# Patient Record
Sex: Female | Born: 1955 | Race: Black or African American | Hispanic: No | Marital: Married | Smoking: Never smoker
Health system: Southern US, Community
[De-identification: ages and names within clinical notes are randomized; demographics above are authoritative.]

## PROBLEM LIST (undated history)

## (undated) DIAGNOSIS — R51 Headache: Secondary | ICD-10-CM

## (undated) DIAGNOSIS — M1990 Unspecified osteoarthritis, unspecified site: Secondary | ICD-10-CM

## (undated) DIAGNOSIS — I10 Essential (primary) hypertension: Secondary | ICD-10-CM

## (undated) DIAGNOSIS — L0390 Cellulitis, unspecified: Secondary | ICD-10-CM

## (undated) HISTORY — PX: BACK SURGERY: SHX140

## (undated) HISTORY — PX: CARPAL TUNNEL RELEASE: SHX101

---

## 1998-05-23 ENCOUNTER — Encounter: Admission: RE | Admit: 1998-05-23 | Discharge: 1998-05-23 | Payer: Self-pay | Admitting: Family Medicine

## 1998-05-29 ENCOUNTER — Encounter: Admission: RE | Admit: 1998-05-29 | Discharge: 1998-05-29 | Payer: Self-pay | Admitting: Sports Medicine

## 1998-06-05 ENCOUNTER — Encounter: Admission: RE | Admit: 1998-06-05 | Discharge: 1998-06-05 | Payer: Self-pay | Admitting: Sports Medicine

## 1998-06-06 ENCOUNTER — Encounter: Admission: RE | Admit: 1998-06-06 | Discharge: 1998-06-06 | Payer: Self-pay | Admitting: Family Medicine

## 1999-06-26 ENCOUNTER — Encounter: Admission: RE | Admit: 1999-06-26 | Discharge: 1999-06-26 | Payer: Self-pay | Admitting: Family Medicine

## 2000-06-28 ENCOUNTER — Encounter: Admission: RE | Admit: 2000-06-28 | Discharge: 2000-06-28 | Payer: Self-pay | Admitting: Family Medicine

## 2000-07-13 ENCOUNTER — Encounter: Payer: Self-pay | Admitting: Sports Medicine

## 2000-07-13 ENCOUNTER — Encounter: Admission: RE | Admit: 2000-07-13 | Discharge: 2000-07-13 | Payer: Self-pay | Admitting: Sports Medicine

## 2001-03-15 ENCOUNTER — Encounter: Admission: RE | Admit: 2001-03-15 | Discharge: 2001-03-15 | Payer: Self-pay | Admitting: Family Medicine

## 2001-04-07 ENCOUNTER — Encounter: Admission: RE | Admit: 2001-04-07 | Discharge: 2001-04-07 | Payer: Self-pay | Admitting: Family Medicine

## 2001-04-29 ENCOUNTER — Encounter: Admission: RE | Admit: 2001-04-29 | Discharge: 2001-04-29 | Payer: Self-pay | Admitting: Family Medicine

## 2001-07-14 ENCOUNTER — Encounter: Admission: RE | Admit: 2001-07-14 | Discharge: 2001-07-14 | Payer: Self-pay | Admitting: Family Medicine

## 2001-07-14 ENCOUNTER — Encounter: Payer: Self-pay | Admitting: Family Medicine

## 2001-07-26 ENCOUNTER — Encounter: Admission: RE | Admit: 2001-07-26 | Discharge: 2001-07-26 | Payer: Self-pay | Admitting: Family Medicine

## 2001-07-28 ENCOUNTER — Encounter: Admission: RE | Admit: 2001-07-28 | Discharge: 2001-07-28 | Payer: Self-pay | Admitting: Family Medicine

## 2001-07-29 ENCOUNTER — Encounter: Admission: RE | Admit: 2001-07-29 | Discharge: 2001-07-29 | Payer: Self-pay | Admitting: Sports Medicine

## 2001-07-29 ENCOUNTER — Encounter: Payer: Self-pay | Admitting: Sports Medicine

## 2001-08-23 ENCOUNTER — Encounter: Admission: RE | Admit: 2001-08-23 | Discharge: 2001-08-23 | Payer: Self-pay | Admitting: Sports Medicine

## 2001-08-24 ENCOUNTER — Encounter: Admission: RE | Admit: 2001-08-24 | Discharge: 2001-08-24 | Payer: Self-pay | Admitting: Family Medicine

## 2001-08-25 ENCOUNTER — Encounter: Admission: RE | Admit: 2001-08-25 | Discharge: 2001-08-25 | Payer: Self-pay | Admitting: Sports Medicine

## 2001-09-28 ENCOUNTER — Encounter: Admission: RE | Admit: 2001-09-28 | Discharge: 2001-09-28 | Payer: Self-pay | Admitting: Family Medicine

## 2001-10-28 ENCOUNTER — Encounter: Admission: RE | Admit: 2001-10-28 | Discharge: 2001-10-28 | Payer: Self-pay | Admitting: Family Medicine

## 2001-12-01 ENCOUNTER — Encounter: Admission: RE | Admit: 2001-12-01 | Discharge: 2001-12-01 | Payer: Self-pay | Admitting: Sports Medicine

## 2002-02-20 ENCOUNTER — Encounter: Admission: RE | Admit: 2002-02-20 | Discharge: 2002-02-20 | Payer: Self-pay | Admitting: Family Medicine

## 2002-04-19 ENCOUNTER — Encounter: Admission: RE | Admit: 2002-04-19 | Discharge: 2002-04-19 | Payer: Self-pay | Admitting: Family Medicine

## 2002-07-31 ENCOUNTER — Encounter: Admission: RE | Admit: 2002-07-31 | Discharge: 2002-07-31 | Payer: Self-pay | Admitting: Sports Medicine

## 2002-07-31 ENCOUNTER — Encounter: Payer: Self-pay | Admitting: Sports Medicine

## 2002-08-22 ENCOUNTER — Encounter: Admission: RE | Admit: 2002-08-22 | Discharge: 2002-08-22 | Payer: Self-pay | Admitting: Family Medicine

## 2002-08-22 ENCOUNTER — Encounter: Payer: Self-pay | Admitting: Sports Medicine

## 2002-08-22 ENCOUNTER — Encounter: Admission: RE | Admit: 2002-08-22 | Discharge: 2002-08-22 | Payer: Self-pay | Admitting: Sports Medicine

## 2002-09-04 ENCOUNTER — Encounter: Admission: RE | Admit: 2002-09-04 | Discharge: 2002-09-04 | Payer: Self-pay | Admitting: Family Medicine

## 2002-10-03 ENCOUNTER — Encounter: Admission: RE | Admit: 2002-10-03 | Discharge: 2002-10-03 | Payer: Self-pay | Admitting: Family Medicine

## 2003-02-28 ENCOUNTER — Encounter: Admission: RE | Admit: 2003-02-28 | Discharge: 2003-02-28 | Payer: Self-pay | Admitting: Family Medicine

## 2003-04-19 ENCOUNTER — Encounter: Admission: RE | Admit: 2003-04-19 | Discharge: 2003-04-19 | Payer: Self-pay | Admitting: Family Medicine

## 2003-05-16 ENCOUNTER — Encounter: Admission: RE | Admit: 2003-05-16 | Discharge: 2003-05-16 | Payer: Self-pay | Admitting: Neurosurgery

## 2003-05-29 ENCOUNTER — Inpatient Hospital Stay (HOSPITAL_COMMUNITY): Admission: RE | Admit: 2003-05-29 | Discharge: 2003-06-01 | Payer: Self-pay | Admitting: Neurosurgery

## 2003-06-05 ENCOUNTER — Ambulatory Visit: Admission: RE | Admit: 2003-06-05 | Discharge: 2003-06-05 | Payer: Self-pay | Admitting: Neurosurgery

## 2003-07-16 ENCOUNTER — Encounter: Admission: RE | Admit: 2003-07-16 | Discharge: 2003-07-16 | Payer: Self-pay | Admitting: Neurosurgery

## 2003-08-02 ENCOUNTER — Encounter: Admission: RE | Admit: 2003-08-02 | Discharge: 2003-08-02 | Payer: Self-pay | Admitting: Sports Medicine

## 2003-08-29 ENCOUNTER — Encounter: Admission: RE | Admit: 2003-08-29 | Discharge: 2003-08-29 | Payer: Self-pay | Admitting: Neurosurgery

## 2003-10-26 ENCOUNTER — Encounter: Admission: RE | Admit: 2003-10-26 | Discharge: 2003-10-26 | Payer: Self-pay | Admitting: Sports Medicine

## 2003-12-03 ENCOUNTER — Emergency Department (HOSPITAL_COMMUNITY): Admission: EM | Admit: 2003-12-03 | Discharge: 2003-12-04 | Payer: Self-pay | Admitting: Emergency Medicine

## 2004-01-23 ENCOUNTER — Encounter: Admission: RE | Admit: 2004-01-23 | Discharge: 2004-01-23 | Payer: Self-pay | Admitting: Sports Medicine

## 2004-03-14 ENCOUNTER — Ambulatory Visit: Payer: Self-pay | Admitting: Family Medicine

## 2004-08-19 ENCOUNTER — Encounter: Admission: RE | Admit: 2004-08-19 | Discharge: 2004-08-19 | Payer: Self-pay | Admitting: Radiology

## 2005-01-11 ENCOUNTER — Encounter (INDEPENDENT_AMBULATORY_CARE_PROVIDER_SITE_OTHER): Payer: Self-pay | Admitting: *Deleted

## 2005-01-23 ENCOUNTER — Ambulatory Visit: Payer: Self-pay | Admitting: Family Medicine

## 2005-02-06 ENCOUNTER — Ambulatory Visit: Payer: Self-pay | Admitting: Sports Medicine

## 2005-02-10 ENCOUNTER — Ambulatory Visit: Payer: Self-pay | Admitting: Family Medicine

## 2005-02-10 ENCOUNTER — Encounter: Admission: RE | Admit: 2005-02-10 | Discharge: 2005-02-10 | Payer: Self-pay | Admitting: Sports Medicine

## 2005-03-18 ENCOUNTER — Ambulatory Visit: Payer: Self-pay | Admitting: Family Medicine

## 2005-07-02 ENCOUNTER — Ambulatory Visit: Payer: Self-pay | Admitting: Family Medicine

## 2005-09-02 ENCOUNTER — Encounter: Admission: RE | Admit: 2005-09-02 | Discharge: 2005-09-02 | Payer: Self-pay | Admitting: Sports Medicine

## 2005-10-05 ENCOUNTER — Ambulatory Visit: Payer: Self-pay | Admitting: Sports Medicine

## 2006-03-10 ENCOUNTER — Ambulatory Visit: Payer: Self-pay | Admitting: Sports Medicine

## 2006-03-22 ENCOUNTER — Emergency Department (HOSPITAL_COMMUNITY): Admission: EM | Admit: 2006-03-22 | Discharge: 2006-03-22 | Payer: Self-pay | Admitting: Emergency Medicine

## 2006-03-26 ENCOUNTER — Ambulatory Visit: Payer: Self-pay | Admitting: Family Medicine

## 2006-04-16 ENCOUNTER — Ambulatory Visit: Payer: Self-pay | Admitting: Family Medicine

## 2006-04-21 ENCOUNTER — Ambulatory Visit: Payer: Self-pay | Admitting: Family Medicine

## 2006-05-06 ENCOUNTER — Ambulatory Visit: Payer: Self-pay | Admitting: Family Medicine

## 2006-05-21 ENCOUNTER — Ambulatory Visit: Payer: Self-pay | Admitting: Sports Medicine

## 2006-06-30 ENCOUNTER — Ambulatory Visit: Payer: Self-pay | Admitting: Family Medicine

## 2006-08-05 DIAGNOSIS — E669 Obesity, unspecified: Secondary | ICD-10-CM

## 2006-08-05 DIAGNOSIS — I10 Essential (primary) hypertension: Secondary | ICD-10-CM

## 2006-08-05 DIAGNOSIS — M1990 Unspecified osteoarthritis, unspecified site: Secondary | ICD-10-CM

## 2006-08-05 DIAGNOSIS — K219 Gastro-esophageal reflux disease without esophagitis: Secondary | ICD-10-CM

## 2006-08-06 ENCOUNTER — Encounter (INDEPENDENT_AMBULATORY_CARE_PROVIDER_SITE_OTHER): Payer: Self-pay | Admitting: *Deleted

## 2006-08-25 ENCOUNTER — Encounter (INDEPENDENT_AMBULATORY_CARE_PROVIDER_SITE_OTHER): Payer: Self-pay | Admitting: Family Medicine

## 2006-09-07 ENCOUNTER — Other Ambulatory Visit: Admission: RE | Admit: 2006-09-07 | Discharge: 2006-09-07 | Payer: Self-pay | Admitting: Nephrology

## 2006-09-08 ENCOUNTER — Encounter (INDEPENDENT_AMBULATORY_CARE_PROVIDER_SITE_OTHER): Payer: Self-pay | Admitting: Family Medicine

## 2006-09-08 ENCOUNTER — Encounter: Admission: RE | Admit: 2006-09-08 | Discharge: 2006-09-08 | Payer: Self-pay | Admitting: Sports Medicine

## 2006-10-20 ENCOUNTER — Encounter (INDEPENDENT_AMBULATORY_CARE_PROVIDER_SITE_OTHER): Payer: Self-pay | Admitting: Family Medicine

## 2006-11-24 ENCOUNTER — Encounter (INDEPENDENT_AMBULATORY_CARE_PROVIDER_SITE_OTHER): Payer: Self-pay | Admitting: Family Medicine

## 2006-12-20 ENCOUNTER — Encounter: Payer: Self-pay | Admitting: Family Medicine

## 2007-05-03 ENCOUNTER — Encounter: Admission: RE | Admit: 2007-05-03 | Discharge: 2007-05-03 | Payer: Self-pay | Admitting: Nephrology

## 2007-06-21 ENCOUNTER — Encounter: Payer: Self-pay | Admitting: Family Medicine

## 2007-07-21 ENCOUNTER — Encounter: Payer: Self-pay | Admitting: Family Medicine

## 2007-08-09 ENCOUNTER — Encounter: Admission: RE | Admit: 2007-08-09 | Discharge: 2007-08-09 | Payer: Self-pay | Admitting: Nephrology

## 2007-09-12 ENCOUNTER — Encounter: Admission: RE | Admit: 2007-09-12 | Discharge: 2007-09-12 | Payer: Self-pay | Admitting: *Deleted

## 2007-11-24 ENCOUNTER — Ambulatory Visit (HOSPITAL_BASED_OUTPATIENT_CLINIC_OR_DEPARTMENT_OTHER): Admission: RE | Admit: 2007-11-24 | Discharge: 2007-11-24 | Payer: Self-pay | Admitting: Orthopedic Surgery

## 2007-12-02 ENCOUNTER — Encounter: Payer: Self-pay | Admitting: Family Medicine

## 2008-01-09 ENCOUNTER — Encounter: Payer: Self-pay | Admitting: Family Medicine

## 2008-04-03 ENCOUNTER — Encounter: Payer: Self-pay | Admitting: Family Medicine

## 2008-04-13 ENCOUNTER — Ambulatory Visit: Payer: Self-pay | Admitting: Family Medicine

## 2008-06-12 ENCOUNTER — Encounter: Admission: RE | Admit: 2008-06-12 | Discharge: 2008-06-12 | Payer: Self-pay | Admitting: Nephrology

## 2008-07-18 ENCOUNTER — Encounter: Admission: RE | Admit: 2008-07-18 | Discharge: 2008-07-18 | Payer: Self-pay | Admitting: Nephrology

## 2008-09-17 ENCOUNTER — Encounter: Admission: RE | Admit: 2008-09-17 | Discharge: 2008-09-17 | Payer: Self-pay | Admitting: Nephrology

## 2008-11-13 ENCOUNTER — Emergency Department (HOSPITAL_COMMUNITY): Admission: EM | Admit: 2008-11-13 | Discharge: 2008-11-13 | Payer: Self-pay | Admitting: Family Medicine

## 2009-01-17 ENCOUNTER — Encounter: Admission: RE | Admit: 2009-01-17 | Discharge: 2009-01-17 | Payer: Self-pay | Admitting: Specialist

## 2009-02-07 ENCOUNTER — Ambulatory Visit: Payer: Self-pay | Admitting: Family Medicine

## 2009-03-14 ENCOUNTER — Encounter: Payer: Self-pay | Admitting: Family Medicine

## 2009-03-25 ENCOUNTER — Encounter: Admission: RE | Admit: 2009-03-25 | Discharge: 2009-03-25 | Payer: Self-pay | Admitting: *Deleted

## 2009-03-29 ENCOUNTER — Encounter: Payer: Self-pay | Admitting: Family Medicine

## 2009-06-19 ENCOUNTER — Inpatient Hospital Stay (HOSPITAL_COMMUNITY): Admission: RE | Admit: 2009-06-19 | Discharge: 2009-06-25 | Payer: Self-pay | Admitting: Specialist

## 2009-09-10 ENCOUNTER — Encounter: Payer: Self-pay | Admitting: Family Medicine

## 2009-09-10 ENCOUNTER — Ambulatory Visit: Payer: Self-pay | Admitting: Family Medicine

## 2009-09-12 LAB — CONVERTED CEMR LAB
Alkaline Phosphatase: 111 units/L (ref 39–117)
CO2: 25 meq/L (ref 19–32)
Cholesterol: 223 mg/dL — ABNORMAL HIGH (ref 0–200)
Creatinine, Ser: 0.76 mg/dL (ref 0.40–1.20)
Glucose, Bld: 92 mg/dL (ref 70–99)
HCT: 34.5 % — ABNORMAL LOW (ref 36.0–46.0)
HDL: 45 mg/dL (ref >39)
LDL Cholesterol: 165 mg/dL — ABNORMAL HIGH (ref 0–99)
MCHC: 30.7 g/dL (ref 30.0–36.0)
MCV: 87.1 fL (ref 78.0–100.0)
Platelets: 467 10*3/uL — ABNORMAL HIGH (ref 150–400)
RBC: 3.96 M/uL (ref 3.87–5.11)
Sodium: 139 meq/L (ref 135–145)
Total Bilirubin: 0.4 mg/dL (ref 0.3–1.2)
Total CHOL/HDL Ratio: 5
Triglycerides: 67 mg/dL (ref <150)
VLDL: 13 mg/dL (ref 0–40)
WBC: 6.9 10*3/uL (ref 4.0–10.5)

## 2009-09-19 ENCOUNTER — Encounter: Admission: RE | Admit: 2009-09-19 | Discharge: 2009-09-19 | Payer: Self-pay | Admitting: Internal Medicine

## 2009-09-24 ENCOUNTER — Telehealth: Payer: Self-pay | Admitting: Family Medicine

## 2009-09-25 ENCOUNTER — Encounter: Admission: RE | Admit: 2009-09-25 | Discharge: 2009-09-25 | Payer: Self-pay | Admitting: Internal Medicine

## 2009-10-30 ENCOUNTER — Telehealth: Payer: Self-pay | Admitting: Family Medicine

## 2009-11-08 ENCOUNTER — Ambulatory Visit: Payer: Self-pay | Admitting: Family Medicine

## 2009-11-08 ENCOUNTER — Encounter: Payer: Self-pay | Admitting: Family Medicine

## 2009-11-08 DIAGNOSIS — E785 Hyperlipidemia, unspecified: Secondary | ICD-10-CM | POA: Insufficient documentation

## 2009-11-08 DIAGNOSIS — F329 Major depressive disorder, single episode, unspecified: Secondary | ICD-10-CM

## 2009-11-08 DIAGNOSIS — F3289 Other specified depressive episodes: Secondary | ICD-10-CM | POA: Insufficient documentation

## 2009-11-12 ENCOUNTER — Encounter: Payer: Self-pay | Admitting: Family Medicine

## 2009-11-28 ENCOUNTER — Encounter: Payer: Self-pay | Admitting: Family Medicine

## 2010-01-13 ENCOUNTER — Telehealth: Payer: Self-pay | Admitting: Family Medicine

## 2010-01-17 ENCOUNTER — Ambulatory Visit: Payer: Self-pay | Admitting: Family Medicine

## 2010-01-17 DIAGNOSIS — R42 Dizziness and giddiness: Secondary | ICD-10-CM

## 2010-03-14 ENCOUNTER — Encounter: Payer: Self-pay | Admitting: Family Medicine

## 2010-04-10 ENCOUNTER — Encounter: Payer: Self-pay | Admitting: Family Medicine

## 2010-06-28 ENCOUNTER — Encounter: Payer: Self-pay | Admitting: Neurosurgery

## 2010-06-29 ENCOUNTER — Encounter: Payer: Self-pay | Admitting: Internal Medicine

## 2010-07-10 NOTE — Progress Notes (Signed)
Summary: triage   Phone Note Call from Patient Call back at Home Phone [phone]   Caller: Patient Summary of Call: Pt having some vertigo. Initial call taken by: Clydell Hakim,  January 13, 2010 9:33 AM  Follow-up for Phone Call        LM Follow-up by: Golden Circle RN,  January 13, 2010 9:39 AM  Additional Follow-up for Phone Call Additional follow up Details #1::        started friday night. not better. difficulty standing. vomiting alot. no ride here today. appt made for tomorrow pm when her husband can bring her. advised moving slowly. try small sips of water frequently as tolerated to prevent dehydration. call if able to come sooner.she agreed with plan Additional Follow-up by: Golden Circle RN,  January 13, 2010 9:52 AM

## 2010-07-10 NOTE — Letter (Signed)
Summary: Generic Letter  [facility]  [address]   [place], [place] [address]   Phone: [phone]  Fax: [phone]    11/12/2009  [facility] [place] [address] [place], [place]  [address]  Dear Ms. Shelnutt,  I am happy to inform you that your LDL, the "bad" cholesterol, is much improved on your medicine.  It was 165.  It is now down to 98 and we want it less than 100.  Great job!  Please continue to take the medicine.  If you hve any questions, please call our office.          Sincerely,   Ardeen Garland  MD  Appended Document: Generic Letter mailed

## 2010-07-10 NOTE — Letter (Signed)
Summary: Generic Letter  [facility]     [place], [place]    Phone:   Fax:     03/14/2010  [facility] [place] [address] [place], [place]  [address]  Dear Ms. Armacost,   I am writing to remind you to please schedule a mammogram to screen for breast cancer as well as a colonsocopy to screen for colon cancer.  For your mammogram, please call the [facility] at  [phone].  For your colonoscopy, you can call either [facility] or [facility] to set up an appointment.  The number for [facility] is [phone].  The number for [place] is [phone].  If you have any trouble scheduling it, then please call and let us know and we will try to help arrange it.   I look forward to meeting you at your next well-woman visit!      Sincerely,   Demetria Pore MD  Appended Document: Generic Letter mailed.

## 2010-07-10 NOTE — Assessment & Plan Note (Signed)
Summary: cpe/pap,tcb   Vital Signs:  Patient profile:   55 year old female Height:      60 inches Weight:      201 pounds BMI:     39.40 Pulse rate:   71 / minute BP sitting:   108 / 62  (left arm)  Vitals Entered By: Arlyss Repress CMA, (September 10, 2009 9:29 AM) CC: pap.  Is Patient Diabetic? No Pain Assessment Patient in pain? no        Primary Care Provider:  Ardeen Garland  MD  CC:  pap. Marland Kitchen  History of Present Illness: She is here for a complete physical.  She complains of CP and cough. 1) CP - "comes and goes".  Lasts about a minute.  Feels like a "tootheache".  "not sharp, not pressure, no tightness".  Goes away.  Sometimes sitting, walking, laying down.  Can happen at any time.  NO associated nausea, dizziness, SOB, or diaphoresis.  Going on for about a year.  Not everyday - not even every month.  Thinks it is related to stress.  FAmily is "in chaos".  Has not felt it in 4-6 weeks.  2) Cough - feels like phlegm gets caught.  Only sometimes coughs something up - green or yellow or white and "slimy".  Worse when she has a cold.  Never smoked but her husband smokes.  bothering her for a year.  Bothers her everyday.  No sore throat.  Sinuses do feel congested.  Sneezes a lot.  Does seem to struggle with allergies.  Nose feels congested at night making it difficult to breathe.  Habits & Providers  Alcohol-Tobacco-Diet     Tobacco Status: never  Current Medications (verified): 1)  Fluticasone Propionate 50 Mcg/act Susp (Fluticasone Propionate) .... 2 Sprays Each Nostril Daily For Allergies/congestion 2)  Loratadine 10 Mg Tabs (Loratadine) .Marland Kitchen.. 1 Tab By Mouth Daily For Allergies/congestion 3)  Hydrochlorothiazide 25 Mg Tabs (Hydrochlorothiazide) .Marland Kitchen.. 1 Tab By Mouth Daily For High Blood Pressure.  Physical Exam  General:  obese, alert, NAD vitals reviewed Eyes:  pupils equal, pupils round, and pupils reactive to light.   conjunctiva clear, no injection Ears:  R ear normal and L  ear normal.   Nose:  no external deformity, no external erythema, and no nasal discharge.   Mouth:  OP pink and moist Lungs:  normal WOB, CTAB Heart:  RRR without murmur Abdomen:  obese, soft, ND, NT Genitalia:  unable to palpate adnexae due to habitus. normal introitus, no external lesions, no vaginal discharge, mucosa pink and moist, no vaginal or cervical lesions, no vaginal atrophy, no friaility or hemorrhage, and normal uterus size and position.   Pulses:  2+ radial and dp pulses Extremities:  no edema Cervical Nodes:  No lymphadenopathy noted   Impression & Recommendations:  Problem # 1:  ROUTINE GYNECOLOGICAL EXAMINATION (ICD-V72.31) Assessment New  Pap obtained.  Referred for mammo - patient states it is scheduled for next month already. Discussed colonscopy.  Advised patient to call [facility] or [facility]. STates husband's was from [place] so she will probably call them to schedule it at her earliest convenicne.  Will let us know if she has trouble scheduling.  CMET, FLP, CBC obtained due to HTN and screenign for CV disease.   Orders: [facility] - Est  40-64 yrs (875[address])  Problem # 2:  HYPERTENSION, BENIGN SYSTEMIC (ICD-401.1) Assessment: Unchanged Had been getting her meds from Dr. Bascom Levels.  Vague about how long she was seeing  him and how she came about to see him and [place], as we do the same thing.  However states only coming here now. Does not need refilla t this time though.  Her updated medication list for this problem includes:    Hydrochlorothiazide 25 Mg Tabs (Hydrochlorothiazide) .Marland Kitchen... 1 tab by mouth daily for high blood pressure.  Orders: Lipid-[facility] ([id number]) Comp Met-[facility] [id number]) CBC-[facility] ([address])  Complete Medication List: 1)  Fluticasone Propionate 50 Mcg/act Susp (Fluticasone propionate) .... 2 sprays each nostril daily for allergies/congestion 2)  Loratadine 10 Mg Tabs (Loratadine) .Marland Kitchen.. 1 tab by mouth daily for allergies/congestion 3)  Hydrochlorothiazide 25 Mg Tabs  (Hydrochlorothiazide) .Marland Kitchen.. 1 tab by mouth daily for high blood pressure.  Other Orders: Pap Smear-[facility] ([id number])  Patient Instructions: 1)  Please call to set up your mammogram at your earliest convenience.  The  number is on the handout I gave you. 2)  Now that you are over 50, you should get a colonscopy to ensure you do not have anything that could become colon cancer growing in your colon.  You can call either [facility] or [facility] to set up an appointment.  The number for [facility] is [phone].  The number for [place] is [phone].  If you have any trouble scheduling it, then please call and let us know and we will try to help arrange it.  3)  Use the flonase and loratadine for your cough and phlegm.  Use it everyday. 4)  Please call and let me know what dose your Hydrochlorothiazide is.   Prescriptions: HYDROCHLOROTHIAZIDE 25 MG TABS (HYDROCHLOROTHIAZIDE) 1 tab by mouth daily for high blood pressure.  #33 x 0   Entered and Authorized by:   Ardeen Garland  MD   Signed by:   Ardeen Garland  MD on 09/10/2009   Method used:   Print then Give to Patient   RxID:   [id number] LORATADINE 10 MG TABS (LORATADINE) 1 tab by mouth daily for allergies/congestion  #33 x 11   Entered and Authorized by:   Ardeen Garland  MD   Signed by:   Ardeen Garland  MD on 09/10/2009   Method used:   Print then Give to Patient   RxID:   [id number] FLUTICASONE PROPIONATE 50 MCG/ACT SUSP (FLUTICASONE PROPIONATE) 2 sprays each nostril daily for allergies/congestion  #1 x 11   Entered and Authorized by:   Ardeen Garland  MD   Signed by:   Ardeen Garland  MD on 09/10/2009   Method used:   Print then Give to Patient   RxID:   [id number]   Prevention & Chronic Care Immunizations   Influenza vaccine: Not documented    Tetanus booster: 06/09/1999: Done.    Pneumococcal vaccine: Not documented  Colorectal Screening   Hemoccult: Done.  (02/06/2005)    Colonoscopy: Not documented  Other  Screening   Pap smear: Done.  (01/11/2005)    Mammogram: April 2009 - BiRads 1  (09/15/2007)   Mammogram action/deferral: Screening mammogram in 1 year.     (09/08/2006)   Smoking status: never  (09/10/2009)  Lipids   Total Cholesterol: Not documented   LDL: Not documented   LDL Direct: Not documented   HDL: Not documented   Triglycerides: Not documented  Hypertension   Last Blood Pressure: 108 / 62  (09/10/2009)   Serum creatinine: Not documented   Serum potassium Not documented CMP ordered     Hypertension flowsheet reviewed?: Yes   Progress toward BP goal:  At goal  Self-Management Support :   Personal Goals (by the next clinic visit) :      Personal blood pressure goal: 140/90  (09/10/2009)   Hypertension self-management support: Not documented

## 2010-07-10 NOTE — Progress Notes (Signed)
  Medications Added FERROUS SULFATE 325 (65 FE) MG TABS (FERROUS SULFATE) 1 tab by mouth daily for anemia SIMVASTATIN 40 MG TABS (SIMVASTATIN) 1 tab by mouth qHS for high cholesterol       Phone Note Outgoing Call   Call placed by: Ardeen Garland, MD Call placed to: Patient Reason for Call: Discuss lab or test results Details for Reason: Give lab results, discuss med need Summary of Call: Informed patient of normal pap.  She is anemic - she knew this.  She has been since her surgery.  Not taking any iron.  Also, cholesterol is high.  Will start simvastatin.  Patient okay with this.  Will let us know if she develops muscle pain or weakness and will return in 2 months for a recheck.  [facility] [address].     New/Updated Medications: FERROUS SULFATE 325 (65 FE) MG TABS (FERROUS SULFATE) 1 tab by mouth daily for anemia SIMVASTATIN 40 MG TABS (SIMVASTATIN) 1 tab by mouth qHS for high cholesterol Prescriptions: SIMVASTATIN 40 MG TABS (SIMVASTATIN) 1 tab by mouth qHS for high cholesterol  #30 x 5   Entered and Authorized by:   Ardeen Garland  MD   Signed by:   Ardeen Garland  MD on 09/24/2009   Method used:   [facility]        [facility]  [address] [id number]       [address]       [place], [place]  [address]       Ph: [phone] or [phone]       Fax: [phone]   RxID:   [id number] FERROUS SULFATE 325 (65 FE) MG TABS (FERROUS SULFATE) 1 tab by mouth daily for anemia  #30 x 1   Entered and Authorized by:   Ardeen Garland  MD   Signed by:   Ardeen Garland  MD on 09/24/2009   Method used:   [facility]  [address] [id number]       [address]       [place], [place]  [address]       Ph: [phone] or [phone]       Fax: [phone]   RxID:   [id number]

## 2010-07-10 NOTE — Assessment & Plan Note (Signed)
Summary: vertigo,tcb   Vital Signs:  Patient profile:   55 year old female Height:      60 inches Weight:      210 pounds BMI:     41.16 Temp:     99.6 degrees F oral BP sitting:   118 / 84  (left arm) Cuff size:   large  Vitals Entered By: Tessie Fass CMA (January 17, 2010 10:36 AM) CC: vertigo Is Patient Diabetic? No Pain Assessment Patient in pain? yes     Location: left ear Intensity: 3   Primary Care Provider:  Ardeen Garland  MD  CC:  vertigo.  History of Present Illness: Here for refill on her meclizine.  She reports a history of vertigo since 2004.  She has several bouts per year, her left ear hurts and rings during the attacks.  He hearing in that ear is not as good.  She finds that meclizine is effective.  She denies fall.  Multiple back surgeries, most recent this past January.  Current Medications (verified): 1)  Fluticasone Propionate 50 Mcg/act Susp (Fluticasone Propionate) .... 2 Sprays Each Nostril Daily For Allergies/congestion 2)  Loratadine 10 Mg Tabs (Loratadine) .Marland Kitchen.. 1 Tab By Mouth Daily For Allergies/congestion 3)  Hydrochlorothiazide 25 Mg Tabs (Hydrochlorothiazide) .Marland Kitchen.. 1 Tab By Mouth Daily For High Blood Pressure. 4)  Simvastatin 40 Mg Tabs (Simvastatin) .Marland Kitchen.. 1 Tab By Mouth Qhs For High Cholesterol 5)  Meloxicam 7.5 Mg Tabs (Meloxicam) .Marland Kitchen.. 1 Tab By Mouth Daily For Arthritis 6)  Fluoxetine Hcl 20 Mg Caps (Fluoxetine Hcl) .Marland Kitchen.. 1 Tab By Mouth Daily For Depression 7)  Meclizine Hcl 25 Mg Tabs (Meclizine Hcl) .... One Tab Q 8 Hours As Needed Vertigo  Allergies (verified): No Known Drug Allergies  Review of Systems General:  Denies chills and fever. ENT:  Complains of earache and ringing in ears; denies nasal congestion, sinus pressure, and sore throat. Neuro:  Complains of sensation of room spinning; denies poor balance and visual disturbances.  Physical Exam  General:  Obese, slow moving Eyes:  PERL, EOMs jumpy and movement triggers  vertigo Ears:  TM grey with normal landmaarks.  Testing to gross hearing decreased on the left as compaired to the right. Lungs:  normal respiratory effort and normal breath sounds.   Heart:  normal rate and regular rhythm.   Msk:  Lower back in flexion position    Impression & Recommendations:  Problem # 1:  INTERMITTENT VERTIGO (ICD-780.4)  may have Menie[place], with tinnitis, hearing decreased and recurrance of symptoms several times a year.  Refilled meclizine Her updated medication list for this problem includes:    Loratadine 10 Mg Tabs (Loratadine) .Marland Kitchen... 1 tab by mouth daily for allergies/congestion    Meclizine Hcl 25 Mg Tabs (Meclizine hcl) ..... One tab q 8 hours as needed vertigo  Orders: [facility]- Est Level  3 (14782)  Complete Medication List: 1)  Fluticasone Propionate 50 Mcg/act Susp (Fluticasone propionate) .... 2 sprays each nostril daily for allergies/congestion 2)  Loratadine 10 Mg Tabs (Loratadine) .Marland Kitchen.. 1 tab by mouth daily for allergies/congestion 3)  Hydrochlorothiazide 25 Mg Tabs (Hydrochlorothiazide) .Marland Kitchen.. 1 tab by mouth daily for high blood pressure. 4)  Simvastatin 40 Mg Tabs (Simvastatin) .Marland Kitchen.. 1 tab by mouth qhs for high cholesterol 5)  Meloxicam 7.5 Mg Tabs (Meloxicam) .Marland Kitchen.. 1 tab by mouth daily for arthritis 6)  Fluoxetine Hcl 20 Mg Caps (Fluoxetine hcl) .Marland Kitchen.. 1 tab by mouth daily for depression 7)  Meclizine Hcl 25 Mg Tabs (  Meclizine hcl) .... One tab q 8 hours as needed vertigo  Patient Instructions: 1)  Be very careful driving 2)  If vertigo would not improve may need to see specialist  Prescriptions: MECLIZINE HCL 25 MG TABS (MECLIZINE HCL) one tab q 8 hours as needed vertigo  #90 x 1   Entered and Authorized by:   Luretha Murphy NP   Signed by:   Luretha Murphy NP on 01/17/2010   Method used:   [facility]  [address] [id number]       [address]       [place], [place]  [address]       Ph: [phone] or  [phone]       Fax: [phone]   RxID:   [id number]

## 2010-07-10 NOTE — Assessment & Plan Note (Signed)
Summary: f/u,df   Vital Signs:  Patient profile:   54 year old female Height:      60 inches Weight:      206 pounds BMI:     40.38 Temp:     99.0 degrees F oral Pulse rate:   91 / minute BP sitting:   101 / 73  (left arm) Cuff size:   large  Vitals Entered By: Tessie Fass CMA (November 08, 2009 8:52 AM) CC: F/U Is Patient Diabetic? No Pain Assessment Patient in pain? no        Primary Care Provider:  Ardeen Garland  MD  CC:  F/U.  History of Present Illness: Ms. Rotondo is here for follow-up of high cholesterol and also endorses depression. 1) HLD - diagnosed on labwork at her physical in April. Started on simvstatin 40 at night.  Has been taking every night for about 6 weeks.  Tolerating it well.  No muscle pain, weakness, or GI side effects. 2) Depression - has 3 daughters.  Oletta Lamas, and [place].  Octavio Graves is the youngest and causing a lot of problems.  Stresses her out a lot.  Feeling "so depressed" about the whole situation.  No SI or HI.  Would like to try something for this.  Denies trouble sleeping.    Habits & Providers  Alcohol-Tobacco-Diet     Tobacco Status: never  Physical Exam  General:  obese, alert, NAD vitals reviewed Lungs:  Normal respiratory effort, chest expands symmetrically. Lungs are clear to auscultation, no crackles or wheezes. Heart:  Normal rate and regular rhythm. S1 and S2 normal without gallop, murmur, click, rub or other extra sounds. Psych:  fair eye contact. Oriented X3, memory intact for recent and remote, normally interactive, and flat affect.     Impression & Recommendations:  Problem # 1:  HYPERLIPIDEMIA (ICD-272.4) Assessment New Recheck direct LDL to look for improvement on simvastatin.  Her updated medication list for this problem includes:    Simvastatin 40 Mg Tabs (Simvastatin) .Marland Kitchen... 1 tab by mouth qhs for high cholesterol  Orders: Direct LDL-[facility] ([address]-98119) [facility]- Est  Level 4 (14782)  Problem # 2:  DEPRESSION  (ICD-311) Assessment: New  Will try fluoxetine.   Her updated medication list for this problem includes:    Fluoxetine Hcl 20 Mg Caps (Fluoxetine hcl) .Marland Kitchen... 1 tab by mouth daily for depression  Orders: [facility]- Est  Level 4 (95621)  Complete Medication List: 1)  Fluticasone Propionate 50 Mcg/act Susp (Fluticasone propionate) .... 2 sprays each nostril daily for allergies/congestion 2)  Loratadine 10 Mg Tabs (Loratadine) .Marland Kitchen.. 1 tab by mouth daily for allergies/congestion 3)  Hydrochlorothiazide 25 Mg Tabs (Hydrochlorothiazide) .Marland Kitchen.. 1 tab by mouth daily for high blood pressure. 4)  Ferrous Sulfate 325 (65 Fe) Mg Tabs (Ferrous sulfate) .Marland Kitchen.. 1 tab by mouth daily for anemia 5)  Simvastatin 40 Mg Tabs (Simvastatin) .Marland Kitchen.. 1 tab by mouth qhs for high cholesterol 6)  Meloxicam 7.5 Mg Tabs (Meloxicam) .Marland Kitchen.. 1 tab by mouth daily for arthritis 7)  Fluoxetine Hcl 20 Mg Caps (Fluoxetine hcl) .Marland Kitchen.. 1 tab by mouth daily for depression  Patient Instructions: 1)  Use the meloxicam once a day for your arthritis.  If you aren't hurting, don't use it, but only take it once a day. 2)  The fluoxetine is for depression.  It can take several weeks to really notice a change for the better.  However, if you feel it is making your feel worse, stop it and  let me know. 3)  Please return in 3 months for your next check-up or sooner if the fluoxetine isn't helping within 6-8 weeks.  Prescriptions: FLUOXETINE HCL 20 MG CAPS (FLUOXETINE HCL) 1 tab by mouth daily for depression  #33 x 2   Entered and Authorized by:   Ardeen Garland  MD   Signed by:   Ardeen Garland  MD on 11/08/2009   Method used:   Print then Give to Patient   RxID:   [id number] MELOXICAM 7.5 MG TABS (MELOXICAM) 1 tab by mouth daily for arthritis  #33 x 2   Entered and Authorized by:   Ardeen Garland  MD   Signed by:   Ardeen Garland  MD on 11/08/2009   Method used:   Print then Give to Patient   RxID:   [id number]

## 2010-07-10 NOTE — Progress Notes (Signed)
Summary: phn msg   Phone Note Call from Patient Call back at Home Phone [phone]   Caller: Patient Summary of Call: Wanted to let Dr. Georgiana Shore know that she has scheduled colonoscopy for June 16th and they need lab work faxed to [facility].   Initial call taken by: Clydell Hakim,  Oct 30, 2009 9:02 AM  Follow-up for Phone Call        faxed most recent labs from 09/10/09 visit. Follow-up by: Tessie Fass CMA,  Oct 30, 2009 11:55 AM

## 2010-07-10 NOTE — Consult Note (Signed)
[facility]  [facility]   Imported By: Clydell Hakim 12/12/2009 15:57:23  _____________________________________________________________________  External Attachment:    Type:   Image     Comment:   External Document

## 2010-07-10 NOTE — Miscellaneous (Signed)
   Clinical Lists Changes  Problems: Removed problem of SCREENING FOR MALIGNANT NEOPLASM OF THE CERVIX (ICD-V76.2) Removed problem of GANGLION, UNSPECIFIED (ICD-727.43) Removed problem of ANEMIA, OTHER, UNSPECIFIED (ICD-285.9)

## 2010-08-24 LAB — CBC
HCT: 31.3 % — ABNORMAL LOW (ref 36.0–46.0)
HCT: 37.2 % (ref 36.0–46.0)
Hemoglobin: 10.4 g/dL — ABNORMAL LOW (ref 12.0–15.0)
MCHC: 32.8 g/dL (ref 30.0–36.0)
MCHC: 33.1 g/dL (ref 30.0–36.0)
MCHC: 33.3 g/dL (ref 30.0–36.0)
MCV: 89.3 fL (ref 78.0–100.0)
MCV: 89.8 fL (ref 78.0–100.0)
MCV: 90.3 fL (ref 78.0–100.0)
Platelets: 287 10*3/uL (ref 150–400)
Platelets: 334 10*3/uL (ref 150–400)
RBC: 3.43 MIL/uL — ABNORMAL LOW (ref 3.87–5.11)
RDW: 14 % (ref 11.5–15.5)
RDW: 14.1 % (ref 11.5–15.5)

## 2010-08-24 LAB — BASIC METABOLIC PANEL
BUN: 7 mg/dL (ref 6–23)
BUN: 8 mg/dL (ref 6–23)
CO2: 29 mEq/L (ref 19–32)
Calcium: 8.4 mg/dL (ref 8.4–10.5)
Calcium: 8.5 mg/dL (ref 8.4–10.5)
Chloride: 102 mEq/L (ref 96–112)
Creatinine, Ser: 0.9 mg/dL (ref 0.4–1.2)
GFR calc Af Amer: >60 mL/min (ref >60)
GFR calc non Af Amer: >60 mL/min (ref >60)
Glucose, Bld: 111 mg/dL — ABNORMAL HIGH (ref 70–99)
Glucose, Bld: 127 mg/dL — ABNORMAL HIGH (ref 70–99)
Sodium: 136 mEq/L (ref 135–145)

## 2010-08-24 LAB — COMPREHENSIVE METABOLIC PANEL
Albumin: 3.2 g/dL — ABNORMAL LOW (ref 3.5–5.2)
BUN: 14 mg/dL (ref 6–23)
Calcium: 9.6 mg/dL (ref 8.4–10.5)
Chloride: 105 mEq/L (ref 96–112)
Creatinine, Ser: 0.88 mg/dL (ref 0.4–1.2)
GFR calc Af Amer: >60 mL/min (ref >60)
Total Bilirubin: 0.3 mg/dL (ref 0.3–1.2)
Total Protein: 8.2 g/dL (ref 6.0–8.3)

## 2010-08-24 LAB — URINALYSIS, ROUTINE W REFLEX MICROSCOPIC
Bilirubin Urine: NEGATIVE
Nitrite: NEGATIVE
Specific Gravity, Urine: 1.022 (ref 1.005–1.030)
Urobilinogen, UA: 0.2 mg/dL (ref 0.0–1.0)

## 2010-08-24 LAB — PROTIME-INR: INR: 1.01 (ref 0.00–1.49)

## 2010-08-24 LAB — APTT: aPTT: 27 seconds (ref 24–37)

## 2010-08-25 ENCOUNTER — Other Ambulatory Visit: Payer: Self-pay | Admitting: Internal Medicine

## 2010-08-25 DIAGNOSIS — Z1231 Encounter for screening mammogram for malignant neoplasm of breast: Secondary | ICD-10-CM

## 2010-08-26 ENCOUNTER — Emergency Department (HOSPITAL_COMMUNITY)
Admission: EM | Admit: 2010-08-26 | Discharge: 2010-08-26 | Disposition: A | Discharge status: Home / Self Care | Payer: BC Managed Care – PPO | Attending: Emergency Medicine | Admitting: Emergency Medicine

## 2010-08-26 DIAGNOSIS — E876 Hypokalemia: Secondary | ICD-10-CM | POA: Insufficient documentation

## 2010-08-26 DIAGNOSIS — R040 Epistaxis: Secondary | ICD-10-CM | POA: Insufficient documentation

## 2010-08-26 DIAGNOSIS — I10 Essential (primary) hypertension: Secondary | ICD-10-CM | POA: Insufficient documentation

## 2010-08-26 LAB — CBC
MCV: 87.9 fL (ref 78.0–100.0)
Platelets: 329 10*3/uL (ref 150–400)
RBC: 3.97 MIL/uL (ref 3.87–5.11)
RDW: 14.1 % (ref 11.5–15.5)
WBC: 8.4 10*3/uL (ref 4.0–10.5)

## 2010-08-26 LAB — BASIC METABOLIC PANEL
BUN: 13 mg/dL (ref 6–23)
Chloride: 102 mEq/L (ref 96–112)
Creatinine, Ser: 0.97 mg/dL (ref 0.4–1.2)
GFR calc Af Amer: >60 mL/min (ref >60)
GFR calc non Af Amer: >60 mL/min (ref >60)
Potassium: 3 mEq/L — ABNORMAL LOW (ref 3.5–5.1)

## 2010-08-26 LAB — DIFFERENTIAL
Basophils Absolute: 0.1 10*3/uL (ref 0.0–0.1)
Basophils Relative: 1 % (ref 0–1)
Eosinophils Absolute: 0.2 10*3/uL (ref 0.0–0.7)
Lymphocytes Relative: 30 % (ref 12–46)
Monocytes Absolute: 0.8 10*3/uL (ref 0.1–1.0)
Neutrophils Relative %: 58 % (ref 43–77)

## 2010-09-25 ENCOUNTER — Ambulatory Visit
Admission: RE | Admit: 2010-09-25 | Discharge: 2010-09-25 | Disposition: A | Discharge status: Home / Self Care | Payer: BC Managed Care – PPO | Source: Ambulatory Visit | Attending: Internal Medicine | Admitting: Internal Medicine

## 2010-09-25 DIAGNOSIS — Z1231 Encounter for screening mammogram for malignant neoplasm of breast: Secondary | ICD-10-CM

## 2010-10-21 NOTE — Op Note (Signed)
Helen Brown, Helen Brown                ACCOUNT NO.:  [id number]   MEDICAL RECORD NO.:  [id number]          PATIENT TYPE:  AMB   LOCATION:  [facility]                          FACILITY:  [facility]   PHYSICIAN:  Cindee Salt, M.D.       DATE OF BIRTH:  03/25/1956   DATE OF PROCEDURE:  11/24/2007  DATE OF DISCHARGE:                               OPERATIVE REPORT   PREOPERATIVE DIAGNOSIS:  Carpal tunnel syndrome, right hand.   POSTOPERATIVE DIAGNOSIS:  Carpal tunnel syndrome, right hand.   OPERATION:  Decompression, right median nerve.   SURGEON:  Cindee Salt, MD   ASSISTANT:  Carolyne Fiscal, RN   ANESTHESIA:  General.   DATE OF OPERATION:  November 24, 2007.   ANESTHESIOLOGIST:  Burna Forts, MD.   HISTORY:  The patient is a 55 year old female with a history of carpal  tunnel syndrome, EMG nerve conduction is positive, which has not  responded to conservative treatment.  She has elected to undergo  surgical decompression of median nerve of the right hand.  Pre, peri,  and postoperative course have been discussed along with risks and  complication.  She is aware that there is no guarantee with the surgery;  possibility of infection; recurrence; injury to arteries, nerves, or  tendons; incomplete relief of symptoms; and dystrophy.  In the  preoperative area, the patient was seen.  The extremity marked by both  the patient and surgeon.  Antibiotics given.   PROCEDURE:  The patient was brought to the operating room, where a  general anesthetic was carried out without difficulty under the  direction of Dr. Jacklynn Bue.  She was prepped using DuraPrep in the supine  position with the right arm free after time-out was taken.  The limb was  exsanguinated with an Esmarch bandage.  Tourniquet placed on the forearm  was inflated to 250 mmHg.  A straight incision was made longitudinally  in the palm, carried down through the subcutaneous tissue.  Bleeders  were electrocauterized.  Palmar fascia was split.   Superficial palmar  arch was identified.  Flexor tendon to the ring and little finger  identified to the ulnar side of the median nerve.  The carpal  retinaculum was incised with sharp dissection.  The right angle and  Sewall retractor were placed between skin and forearm fascia.  Fascia  was released for approximately 1.5 cm proximal to the wrist crease under  direct vision.  Canal was explored.  Tenosynovium tissue was thickened.  No further lesions were identified.  Area of compression to the nerve  was apparent with an area of hyperemia.  No further lesions were noted.  The wound was irrigated.  Skin closed with interrupted 5-0 Vicryl Rapide sutures.  Sterile  compressive dressing and splint to the wrist applied.  The patient  tolerated the procedure well and was taken to the recovery room for  observation in satisfactory condition.  She will be discharged home and  to return to the [facility] in 1 week on [place] [facility].           ______________________________  Cindee Salt, M.D.     GK/MEDQ  D:  11/24/2007  T:  11/25/2007  Job:  [id number]   cc:   Dr. Bascom Levels

## 2010-10-24 NOTE — Discharge Summary (Signed)
NAME:  Helen Brown, Helen Brown                          ACCOUNT NO.:  [id number]   MEDICAL RECORD NO.:  [id number]                   PATIENT TYPE:  INP   LOCATION:  3004                                 FACILITY:  [facility]   PHYSICIAN:  Reinaldo Meeker, M.D.              DATE OF BIRTH:  01/17/1956   DATE OF ADMISSION:  05/29/2003  DATE OF DISCHARGE:  06/01/2003                                 DISCHARGE SUMMARY   PRIMARY DIAGNOSIS:  Herniated disc and listhesis L5-S1 right.   PRIMARY OPERATIVE PROCEDURE:  Right L5-S1 T-lift with pedicle and transfacet  screw fixation.   HISTORY OF PRESENT ILLNESS:  Helen Brown is a 55 year old female with severe  right lower extremity pain.  MRI scan shows a grade I slip with a lateral  herniated disc at L5-S1.  She was admitted at this time for a T-lift with  pedicle and transfacet screw fixation.  The patient was taken to the  operating room where she underwent the above named procedure.  The patient  tolerated the procedure well and slowly advanced her diet and activity.   By June 01, 2003, the patient was up ambulating well.  Her wound was  healing well and it was felt she could be discharged home.   DISCHARGE MEDICATIONS:  Pain pills to use on a p.r.n. basis.   CONDITION ON DISCHARGE:  Markedly improved versus admission.                                                Reinaldo Meeker, M.D.    ROK/MEDQ  D:  07/05/2003  T:  07/06/2003  Job:  [id number]

## 2010-10-24 NOTE — Op Note (Signed)
NAME:  EMMERSYN, KRATZKE                          ACCOUNT NO.:  [id number]   MEDICAL RECORD NO.:  [id number]                   PATIENT TYPE:  INP   LOCATION:  3172                                 FACILITY:  [facility]   PHYSICIAN:  Reinaldo Meeker, M.D.              DATE OF BIRTH:  Jun 23, 1955   DATE OF PROCEDURE:  05/29/2003  DATE OF DISCHARGE:                                 OPERATIVE REPORT   PREOPERATIVE DIAGNOSIS:  Grade 1 spondylolisthesis and herniated disc L5-S1,  right.   POSTOPERATIVE DIAGNOSIS:  Grade 1 spondylolisthesis and herniated disc L5-  S1, right.   PROCEDURE:  Right L5-S1 transforaminal lumbar interbody fusion with  microdiscectomy followed by right pedicle screw instrumentation and left  transfacet screw instrumentation, L5-S1 posterolateral fusion.   SECONDARY PROCEDURE:  Microdissection L5-S1 disc, L5 and S1 nerve roots, as  well as interoperative EMG monitoring.   SURGEON:  Reinaldo Meeker, M.D.   ASSISTANT:  Tia Alert, MD   PROCEDURE IN DETAIL:  After being placed in the prone position, the  patient's back was prepped and draped in the usual sterile fashion.  A  localizing x-ray was taken prior to the incision to identify the appropriate  level.  A midline incision was made above the spinous processes of L4, L5,  and S1.  Using Bovie cutting current, the incision was carried to the  spinous processes.  Subperiosteal dissection was then carried out  bilaterally on the spinous processes, lamina, and facet joints.  Additional  lateral dissection was carried out on the patient's right side.  Self-  retaining retractor was placed for exposure.  Fluoroscopy showed we  approached the appropriate level.  The spinous processes and interspinous  ligament of L5-S1 were removed.  On the patient's right side, generous  laminotomy was performed by removing the inferior 2/3 of the L5 lamina, the  medial 3/4 of the facet joint, and the superior 1/3 of the S1 lamina.  Residual bone and ligament were removed in a piecemeal fashion.  Dissection  was carried out until the S1 and L5 nerve roots were both easily identified.  At this point, microdissection was carried out to identify the lateral  aspect of the thecal sac and the L5-S1 disc which was found to be markedly  herniated.  After coagulating the disc was incised with a 15 blade.  Using  pituitary rongeurs and curets, a thorough disc space clean out was carried  out.  At the same time, great care was taken to avoid injury to the neural  elements and this was successfully done.  Great care was taken to clean out  all the disc material within the foramen to decompress the L5 nerve root.  At this point, inspection was carried out in all directions without any  evidence of residual compression identified.   Transforaminal interbody fusion was then carried out.  After distracting the  disc  space up to a 10 mm size, a 10 mm plug was prepared.  After confirming  a good decompression and protecting the L5-S1 nerve roots, the plug was  impacted without difficulty and fluoroscopy showed it to be in good position  in the AP and lateral direction.  Pedicle screws were then placed on the  patient's right side in a standard fashion.  When they were placed into good  position which was confirmed with fluoroscopy, a small rod was secured.  A  locking screw on S1 was secured and prior to placing the locking device on  L5, a marking pressure was carried out.  A single transfacet screw was then  placed on the patient's left side.  A drill over guide-wire was passed to  the inferior facet at L5 and into the pedicle of S1.  Initially, EMG  monitoring showed possible breach of the pedicle, so the drill was  redirected, passed once more, and at that time, showed no evidence of  breach.  AP and lateral fluoroscopy also showed it to be in excellent  position.  At this point, a hole was tapped and screw was placed.  Once   again, EMG testing showed no evidence of breach of the pedicle.  At this  point, large amounts of irrigation were carried out and bleeding controlled  with bipolar coagulation.  A high speed drill was used to decorticate the  lamina on the left at L5-S1.  A posterolateral fusion was performed by  placing bone chips in that area.  The wound was then closed using  interrupted Vicryl in the muscle, fascia, subcutaneous and epidural tissues,  and staples on the skin.  Sterile dressings were then applied and the  patient was extubated and taken to the recovery room in stable condition.                                               Reinaldo Meeker, M.D.    ROK/MEDQ  D:  05/29/2003  T:  05/30/2003  Job:  [id number]

## 2011-03-05 LAB — BASIC METABOLIC PANEL
BUN: 12
Calcium: 8.9
Chloride: 106
Creatinine, Ser: 0.82
GFR calc non Af Amer: >60

## 2011-03-05 LAB — POCT HEMOGLOBIN-HEMACUE: Hemoglobin: 12.3

## 2011-03-10 ENCOUNTER — Other Ambulatory Visit: Payer: Self-pay | Admitting: Family Medicine

## 2011-03-10 MED ORDER — FLUTICASONE PROPIONATE 50 MCG/ACT NA SUSP: 2.0000 | Freq: Every day | NASAL | Status: AC

## 2011-04-17 ENCOUNTER — Other Ambulatory Visit (HOSPITAL_COMMUNITY)
Admission: RE | Admit: 2011-04-17 | Discharge: 2011-04-17 | Disposition: A | Discharge status: Home / Self Care | Payer: BC Managed Care – PPO | Source: Ambulatory Visit | Attending: Internal Medicine | Admitting: Internal Medicine

## 2011-04-17 ENCOUNTER — Other Ambulatory Visit: Payer: Self-pay | Admitting: Internal Medicine

## 2011-04-17 DIAGNOSIS — Z01419 Encounter for gynecological examination (general) (routine) without abnormal findings: Secondary | ICD-10-CM | POA: Insufficient documentation

## 2011-04-17 DIAGNOSIS — Z1159 Encounter for screening for other viral diseases: Secondary | ICD-10-CM | POA: Insufficient documentation

## 2011-08-18 ENCOUNTER — Other Ambulatory Visit: Payer: Self-pay | Admitting: Internal Medicine

## 2011-08-18 DIAGNOSIS — Z1231 Encounter for screening mammogram for malignant neoplasm of breast: Secondary | ICD-10-CM

## 2011-09-15 DIAGNOSIS — M545 Low back pain: Secondary | ICD-10-CM | POA: Diagnosis not present

## 2011-09-30 ENCOUNTER — Ambulatory Visit
Admission: RE | Admit: 2011-09-30 | Discharge: 2011-09-30 | Disposition: A | Discharge status: Home / Self Care | Payer: BC Managed Care – PPO | Source: Ambulatory Visit | Attending: Internal Medicine | Admitting: Internal Medicine

## 2011-09-30 DIAGNOSIS — Z1231 Encounter for screening mammogram for malignant neoplasm of breast: Secondary | ICD-10-CM

## 2012-01-13 DIAGNOSIS — M79609 Pain in unspecified limb: Secondary | ICD-10-CM | POA: Diagnosis not present

## 2012-04-19 ENCOUNTER — Other Ambulatory Visit: Payer: Self-pay | Admitting: Internal Medicine

## 2012-04-19 ENCOUNTER — Other Ambulatory Visit (HOSPITAL_COMMUNITY)
Admission: RE | Admit: 2012-04-19 | Discharge: 2012-04-19 | Disposition: A | Discharge status: Home / Self Care | Payer: Medicare Other | Source: Ambulatory Visit | Attending: Internal Medicine | Admitting: Internal Medicine

## 2012-04-19 DIAGNOSIS — R21 Rash and other nonspecific skin eruption: Secondary | ICD-10-CM | POA: Diagnosis not present

## 2012-04-19 DIAGNOSIS — Z1151 Encounter for screening for human papillomavirus (HPV): Secondary | ICD-10-CM | POA: Diagnosis not present

## 2012-04-19 DIAGNOSIS — E7800 Pure hypercholesterolemia, unspecified: Secondary | ICD-10-CM | POA: Diagnosis not present

## 2012-04-19 DIAGNOSIS — Z23 Encounter for immunization: Secondary | ICD-10-CM | POA: Diagnosis not present

## 2012-04-19 DIAGNOSIS — Z124 Encounter for screening for malignant neoplasm of cervix: Secondary | ICD-10-CM | POA: Insufficient documentation

## 2012-04-19 DIAGNOSIS — I10 Essential (primary) hypertension: Secondary | ICD-10-CM | POA: Diagnosis not present

## 2012-04-19 DIAGNOSIS — M4800 Spinal stenosis, site unspecified: Secondary | ICD-10-CM | POA: Diagnosis not present

## 2012-04-19 DIAGNOSIS — F329 Major depressive disorder, single episode, unspecified: Secondary | ICD-10-CM | POA: Diagnosis not present

## 2012-04-19 DIAGNOSIS — Z0000 Encounter for general adult medical examination without abnormal findings: Secondary | ICD-10-CM | POA: Diagnosis not present

## 2012-08-30 ENCOUNTER — Other Ambulatory Visit: Payer: Self-pay

## 2012-08-30 DIAGNOSIS — Z1231 Encounter for screening mammogram for malignant neoplasm of breast: Secondary | ICD-10-CM

## 2012-09-30 DIAGNOSIS — M25559 Pain in unspecified hip: Secondary | ICD-10-CM | POA: Diagnosis not present

## 2012-10-03 ENCOUNTER — Other Ambulatory Visit: Payer: Self-pay | Admitting: Internal Medicine

## 2012-10-03 ENCOUNTER — Other Ambulatory Visit: Payer: Medicare Other

## 2012-10-03 DIAGNOSIS — M545 Low back pain: Secondary | ICD-10-CM

## 2012-10-05 ENCOUNTER — Ambulatory Visit
Admission: RE | Admit: 2012-10-05 | Discharge: 2012-10-05 | Disposition: A | Discharge status: Home / Self Care | Payer: Medicare Other | Source: Ambulatory Visit

## 2012-10-05 ENCOUNTER — Ambulatory Visit
Admission: RE | Admit: 2012-10-05 | Discharge: 2012-10-05 | Disposition: A | Discharge status: Home / Self Care | Payer: Medicare Other | Source: Ambulatory Visit | Attending: Internal Medicine | Admitting: Internal Medicine

## 2012-10-05 DIAGNOSIS — Z1231 Encounter for screening mammogram for malignant neoplasm of breast: Secondary | ICD-10-CM

## 2012-10-05 DIAGNOSIS — M545 Low back pain: Secondary | ICD-10-CM | POA: Diagnosis not present

## 2012-10-05 DIAGNOSIS — IMO0002 Reserved for concepts with insufficient information to code with codable children: Secondary | ICD-10-CM | POA: Diagnosis not present

## 2012-10-05 MED ORDER — GADOBENATE DIMEGLUMINE 529 MG/ML IV SOLN
20.0000 mL | Freq: Once | INTRAVENOUS | Status: AC | PRN
  Administered 2012-10-05: 20 mL via INTRAVENOUS

## 2012-10-18 DIAGNOSIS — E7800 Pure hypercholesterolemia, unspecified: Secondary | ICD-10-CM | POA: Diagnosis not present

## 2012-10-18 DIAGNOSIS — M545 Low back pain: Secondary | ICD-10-CM | POA: Diagnosis not present

## 2012-10-18 DIAGNOSIS — I10 Essential (primary) hypertension: Secondary | ICD-10-CM | POA: Diagnosis not present

## 2012-12-23 DIAGNOSIS — M48061 Spinal stenosis, lumbar region without neurogenic claudication: Secondary | ICD-10-CM | POA: Diagnosis not present

## 2012-12-23 DIAGNOSIS — M5137 Other intervertebral disc degeneration, lumbosacral region: Secondary | ICD-10-CM | POA: Diagnosis not present

## 2013-04-05 DIAGNOSIS — M545 Low back pain: Secondary | ICD-10-CM | POA: Diagnosis not present

## 2013-04-19 DIAGNOSIS — M545 Low back pain: Secondary | ICD-10-CM | POA: Diagnosis not present

## 2013-04-29 DIAGNOSIS — Z23 Encounter for immunization: Secondary | ICD-10-CM | POA: Diagnosis not present

## 2013-05-24 DIAGNOSIS — M5430 Sciatica, unspecified side: Secondary | ICD-10-CM | POA: Diagnosis not present

## 2013-06-12 ENCOUNTER — Inpatient Hospital Stay (HOSPITAL_COMMUNITY)
Admission: EM | Admit: 2013-06-12 | Discharge: 2013-06-16 | DRG: 603 | Disposition: A | Discharge status: Home / Self Care | Payer: Medicare Other | Attending: Internal Medicine | Admitting: Internal Medicine

## 2013-06-12 ENCOUNTER — Encounter (HOSPITAL_COMMUNITY): Payer: Self-pay | Admitting: Emergency Medicine

## 2013-06-12 DIAGNOSIS — L03119 Cellulitis of unspecified part of limb: Principal | ICD-10-CM

## 2013-06-12 DIAGNOSIS — E876 Hypokalemia: Secondary | ICD-10-CM | POA: Diagnosis not present

## 2013-06-12 DIAGNOSIS — A4902 Methicillin resistant Staphylococcus aureus infection, unspecified site: Secondary | ICD-10-CM | POA: Diagnosis present

## 2013-06-12 DIAGNOSIS — I10 Essential (primary) hypertension: Secondary | ICD-10-CM | POA: Diagnosis present

## 2013-06-12 DIAGNOSIS — L0390 Cellulitis, unspecified: Secondary | ICD-10-CM

## 2013-06-12 DIAGNOSIS — L02419 Cutaneous abscess of limb, unspecified: Secondary | ICD-10-CM | POA: Diagnosis not present

## 2013-06-12 DIAGNOSIS — T502X5A Adverse effect of carbonic-anhydrase inhibitors, benzothiadiazides and other diuretics, initial encounter: Secondary | ICD-10-CM | POA: Diagnosis present

## 2013-06-12 HISTORY — DX: Unspecified osteoarthritis, unspecified site: M19.90

## 2013-06-12 HISTORY — DX: Cellulitis, unspecified: L03.90

## 2013-06-12 HISTORY — DX: Essential (primary) hypertension: I10

## 2013-06-12 HISTORY — DX: Headache: R51

## 2013-06-12 LAB — CBC WITH DIFFERENTIAL/PLATELET
Basophils Absolute: 0.1 10*3/uL (ref 0.0–0.1)
Basophils Relative: 0 % (ref 0–1)
EOS ABS: 0.4 10*3/uL (ref 0.0–0.7)
EOS PCT: 3 % (ref 0–5)
HCT: 35.2 % — ABNORMAL LOW (ref 36.0–46.0)
HEMOGLOBIN: 12 g/dL (ref 12.0–15.0)
LYMPHS ABS: 2.7 10*3/uL (ref 0.7–4.0)
LYMPHS PCT: 18 % (ref 12–46)
MCH: 30.5 pg (ref 26.0–34.0)
MCHC: 34.1 g/dL (ref 30.0–36.0)
MCV: 89.3 fL (ref 78.0–100.0)
MONOS PCT: 7 % (ref 3–12)
Monocytes Absolute: 1.1 10*3/uL — ABNORMAL HIGH (ref 0.1–1.0)
NEUTROS PCT: 72 % (ref 43–77)
Neutro Abs: 11 10*3/uL — ABNORMAL HIGH (ref 1.7–7.7)
Platelets: 405 10*3/uL — ABNORMAL HIGH (ref 150–400)
RBC: 3.94 MIL/uL (ref 3.87–5.11)
RDW: 13.7 % (ref 11.5–15.5)
WBC: 15.2 10*3/uL — AB (ref 4.0–10.5)

## 2013-06-12 LAB — COMPREHENSIVE METABOLIC PANEL
ALK PHOS: 121 U/L — AB (ref 39–117)
ALT: 12 U/L (ref 0–35)
AST: 17 U/L (ref 0–37)
Albumin: 2.8 g/dL — ABNORMAL LOW (ref 3.5–5.2)
BUN: 21 mg/dL (ref 6–23)
CO2: 30 meq/L (ref 19–32)
Calcium: 9.2 mg/dL (ref 8.4–10.5)
Chloride: 100 mEq/L (ref 96–112)
Creatinine, Ser: 1.07 mg/dL (ref 0.50–1.10)
GFR, EST AFRICAN AMERICAN: 66 mL/min — AB (ref >90)
GFR, EST NON AFRICAN AMERICAN: 57 mL/min — AB (ref >90)
GLUCOSE: 84 mg/dL (ref 70–99)
POTASSIUM: 3.1 meq/L — AB (ref 3.7–5.3)
SODIUM: 143 meq/L (ref 137–147)
TOTAL PROTEIN: 8 g/dL (ref 6.0–8.3)
Total Bilirubin: 0.3 mg/dL (ref 0.3–1.2)

## 2013-06-12 MED ORDER — HEPARIN SODIUM (PORCINE) 5000 UNIT/ML IJ SOLN
5000.0000 [IU] | Freq: Three times a day (TID) | INTRAMUSCULAR | Status: DC
  Administered 2013-06-13 – 2013-06-16 (×11): 5000 [IU] via SUBCUTANEOUS
  Filled 2013-06-12 (×14): qty 1

## 2013-06-12 MED ORDER — MELOXICAM 7.5 MG PO TABS
7.5000 mg | ORAL_TABLET | Freq: Every day | ORAL | Status: DC
  Administered 2013-06-13 – 2013-06-16 (×4): 7.5 mg via ORAL
  Filled 2013-06-12 (×5): qty 1

## 2013-06-12 MED ORDER — ONDANSETRON HCL 4 MG/2ML IJ SOLN
4.0000 mg | Freq: Once | INTRAMUSCULAR | Status: AC
  Administered 2013-06-12: 4 mg via INTRAVENOUS
  Filled 2013-06-12: qty 2

## 2013-06-12 MED ORDER — MECLIZINE HCL 25 MG PO TABS
25.0000 mg | ORAL_TABLET | Freq: Three times a day (TID) | ORAL | Status: DC | PRN
  Filled 2013-06-12: qty 1

## 2013-06-12 MED ORDER — MORPHINE SULFATE 4 MG/ML IJ SOLN
4.0000 mg | Freq: Once | INTRAMUSCULAR | Status: AC
  Administered 2013-06-12: 4 mg via INTRAVENOUS
  Filled 2013-06-12: qty 1

## 2013-06-12 MED ORDER — VANCOMYCIN HCL IN DEXTROSE 1-5 GM/200ML-% IV SOLN
1000.0000 mg | Freq: Once | INTRAVENOUS | Status: AC
  Administered 2013-06-12: 1000 mg via INTRAVENOUS
  Filled 2013-06-12: qty 200

## 2013-06-12 MED ORDER — CLINDAMYCIN PHOSPHATE 600 MG/50ML IV SOLN: 600.0000 mg | Freq: Once | INTRAVENOUS | Status: DC

## 2013-06-12 MED ORDER — FLUTICASONE PROPIONATE 50 MCG/ACT NA SUSP
2.0000 | Freq: Every day | NASAL | Status: DC
  Administered 2013-06-14 – 2013-06-16 (×3): 2 via NASAL
  Filled 2013-06-12 (×2): qty 16

## 2013-06-12 MED ORDER — HYDROCHLOROTHIAZIDE 25 MG PO TABS: 25.0000 mg | ORAL_TABLET | Freq: Every day | ORAL | Status: DC

## 2013-06-12 MED ORDER — VANCOMYCIN HCL IN DEXTROSE 1-5 GM/200ML-% IV SOLN
1000.0000 mg | Freq: Two times a day (BID) | INTRAVENOUS | Status: DC
  Administered 2013-06-13 – 2013-06-16 (×7): 1000 mg via INTRAVENOUS
  Filled 2013-06-12 (×10): qty 200

## 2013-06-12 NOTE — ED Notes (Signed)
Irving BurtonEmily, PA at bedside for I/D

## 2013-06-12 NOTE — ED Notes (Signed)
Called for pt with no answer 

## 2013-06-12 NOTE — ED Notes (Signed)
Pt states on christmas, she noticed a small bump on her on left shin leg, she thought it was a hair bump. Pt states its became worse ever since then. Large mass noted to left shin area with small pencil sized area to shin with yellow drainage. Pt states she saw a doctor and started antibiotics for six days, states leg swelling is still getting worse. Pain 8/10, pulses palpable and full ROM

## 2013-06-12 NOTE — H&P (Signed)
[facility] History and Physical  FRANKEE GRITZ ZOX:[id number] DOB: Oct 21, 1955 DOA: 06/12/2013  Referring physician: EDP PCP: No primary provider on file.   Chief Complaint: Cellulitis of leg   HPI: Helen Brown is a 58 y.o. female who presents to the ED with cellulitis and abscess of the LLE.  Abscess was draining some at home after she applied H2O2 to the leg.  Was treated for 6 days as out patient with Doxycycline but despite treatment the cellulitis spread and the swelling got worse.  Patient reports purulent drainage from abscess at home, now serous here in ED.  Saw PCP today in office who sent her to ED for admission and IV abx as a failed outpatient therapy.  Review of Systems: Systems reviewed.  As above, otherwise negative  Past Medical History  Diagnosis Date  . Hypertension    History reviewed. No pertinent past surgical history. Social History:  reports that she has never smoked. She does not have any smokeless tobacco history on file. She reports that she does not drink alcohol or use illicit drugs.  Allergies  Allergen Reactions  . Codeine Rash    History reviewed. No pertinent family history.   Prior to Admission medications   Medication Sig Start Date End Date Taking? Authorizing Provider  doxycycline (VIBRAMYCIN) 100 MG capsule Take 100 mg by mouth daily.   Yes Historical Provider, MD  fluticasone (FLONASE) 50 MCG/ACT nasal spray Place 2 sprays into the nose daily. 2 sprays each nostril daily for allergies/congestion 03/10/11  Yes Jacquelyn A McGill, MD  hydrochlorothiazide 25 MG tablet Take 25 mg by mouth daily. For high blood pressure    Yes Historical Provider, MD  meclizine (ANTIVERT) 25 MG tablet Take 25 mg by mouth every 8 (eight) hours as needed (vertigo). One tab q 8 hours as needed vertigo   Yes Historical Provider, MD  meloxicam (MOBIC) 7.5 MG tablet Take 7.5 mg by mouth daily. For arthritis    Yes Historical Provider, MD   Physical Exam: Filed  Vitals:   06/12/13 2100  BP: 102/57  Pulse: 75  Temp:   Resp:     BP 102/57  Pulse 75  Temp(Src) 98.6 F (37 C) (Oral)  Resp 16  SpO2 100%  General Appearance:    Alert, oriented, no distress, appears stated age  Head:    Normocephalic, atraumatic  Eyes:    PERRL, EOMI, sclera non-icteric        Nose:   Nares without drainage or epistaxis. Mucosa, turbinates normal  Throat:   Moist mucous membranes. Oropharynx without erythema or exudate.  Neck:   Supple. No carotid bruits.  No thyromegaly.  No lymphadenopathy.   Back:     No CVA tenderness, no spinal tenderness  Lungs:     Clear to auscultation bilaterally, without wheezes, rhonchi or rales  Chest wall:    No tenderness to palpitation  Heart:    Regular rate and rhythm without murmurs, gallops, rubs  Abdomen:     Soft, non-tender, nondistended, normal bowel sounds, no organomegaly  Genitalia:    deferred  Rectal:    deferred  Extremities:   No clubbing, cyanosis or edema.  Pulses:   2+ and symmetric all extremities  Skin:   Skin color, texture, turgor normal, no rashes or lesions  Lymph nodes:   Cervical, supraclavicular, and axillary nodes normal  Neurologic:   CNII-XII intact. Normal strength, sensation and reflexes      throughout    Labs  on Admission:  Basic Metabolic Panel:  Recent Labs Lab 06/12/13 1600  NA 143  K 3.1*  CL 100  CO2 30  GLUCOSE 84  BUN 21  CREATININE 1.07  CALCIUM 9.2   Liver Function Tests:  Recent Labs Lab 06/12/13 1600  AST 17  ALT 12  ALKPHOS 121*  BILITOT 0.3  PROT 8.0  ALBUMIN 2.8*   No results found for this basename: LIPASE, AMYLASE,  in the last 168 hours No results found for this basename: AMMONIA,  in the last 168 hours CBC:  Recent Labs Lab 06/12/13 1600  WBC 15.2*  NEUTROABS 11.0*  HGB 12.0  HCT 35.2*  MCV 89.3  PLT 405*   Cardiac Enzymes: No results found for this basename: CKTOTAL, CKMB, CKMBINDEX, TROPONINI,  in the last 168 hours  BNP (last 3  results) No results found for this basename: PROBNP,  in the last 8760 hours CBG: No results found for this basename: GLUCAP,  in the last 168 hours  Radiological Exams on Admission: No results found.  EKG: Independently reviewed.  Assessment/Plan Principal Problem:   Cellulitis and abscess of leg, except foot   1. Cellulitis and abscess of left leg, failed outpatient therapy - admit, vancomycin ordered, wound culture ordered, repeat labs in AM.    Code Status: Full Code  Family Communication: Family at bedside Disposition Plan: Admit to inpatient   Time spent: 50 min  Aerie Donica M. [facility] Pager [phone]  If 7AM-7PM, please contact the day team taking care of the patient [email or web address] Password [email or web address] 06/12/2013, 9:56 PM

## 2013-06-12 NOTE — ED Notes (Signed)
Pt here for eval of leg wound that is draining x 1 week to left leg; pt sts on antibiotics but not improving

## 2013-06-12 NOTE — Progress Notes (Signed)
ANTIBIOTIC CONSULT NOTE - INITIAL  Pharmacy Consult:  Vancomycin Indication:  Cellulitis   Allergies  Allergen Reactions  . Codeine Rash    Patient Measurements: Height: 5' (152.4 cm) Weight: 207 lb (93.895 kg) IBW/kg (Calculated) : 45.5  Vital Signs: Temp: 98.6 F (37 C) (01/05 1939) Temp src: Oral (01/05 1939) BP: 102/57 mmHg (01/05 2100) Pulse Rate: 75 (01/05 2100)  Labs:  Recent Labs  06/12/13 1600  WBC 15.2*  HGB 12.0  PLT 405*  CREATININE 1.07   Estimated Creatinine Clearance: 60.1 ml/min (by C-G formula based on Cr of 1.07). No results found for this basename: VANCOTROUGH, VANCOPEAK, VANCORANDOM, GENTTROUGH, GENTPEAK, GENTRANDOM, TOBRATROUGH, TOBRAPEAK, TOBRARND, AMIKACINPEAK, AMIKACINTROU, AMIKACIN,  in the last 72 hours   Microbiology: No results found for this or any previous visit (from the past 720 hour(s)).  Medical History: Past Medical History  Diagnosis Date  . Hypertension       Assessment: 58 YOF with LLE cellulitis and abscess that did not respond to doxycycline.  Pharmacy consulted to manage vancomycin.  Noted patient received vancomycin 1gm IV around 2130 today.  Baseline labs reviewed.  Doxy 12/31 >> 1/5 Vanc 1/5 >>   Goal of Therapy:  Vancomycin trough level 10-15 mcg/ml   Plan:  - Vanc 1gm IV Q12H - Monitor renal fxn, clinical progress, vanc trough at Css - F/U KCL supplementation    Chyann Ambrocio D. Laney Potashang, PharmD, BCPS Pager:  [phone] 06/12/2013, 10:16 PM

## 2013-06-12 NOTE — ED Provider Notes (Signed)
CSN: [id number]     Arrival date & time 06/12/13  1534 History   First MD Initiated Contact with Patient 06/12/13 1946     Chief Complaint  Patient presents with  . Leg Swelling   (Consider location/radiation/quality/duration/timing/severity/associated sxs/prior Treatment) The history is provided by the patient and the spouse.   Patient sent to the emergency department by primary care provider Dr. Nehemiah Settle. Patient has been treated for an area of cellulitis/abscess on her left shin for the past 6 days of doxycycline. Patient states that the area began looking like a "hair bump "and has gradually increased in size. She had fevers initially but that has subsided. She now has an ulcerated area that is draining yellowish brown fluid. Dr. Nehemiah Settle requested that she come to the emergency department because it might need to be cut open. Patient denies recent fevers, chills, body aches, vomiting. Past Medical History  Diagnosis Date  . Hypertension    History reviewed. No pertinent past surgical history. History reviewed. No pertinent family history. History  Substance Use Topics  . Smoking status: Never Smoker   . Smokeless tobacco: Not on file  . Alcohol Use: No   OB History   Grav Para Term Preterm Abortions TAB SAB Ect Mult Living                 Review of Systems  Constitutional: Negative for fever and chills.  Gastrointestinal: Negative for vomiting.  Musculoskeletal: Negative for myalgias.  Skin: Positive for color change and wound.    Allergies  Codeine  Home Medications   Current Outpatient Rx  Name  Route  Sig  Dispense  Refill  . doxycycline (VIBRAMYCIN) 100 MG capsule   Oral   Take 100 mg by mouth daily.         . fluticasone (FLONASE) 50 MCG/ACT nasal spray   Nasal   Place 2 sprays into the nose daily. 2 sprays each nostril daily for allergies/congestion   16 g   0     Pt needs appt for any further refills.   . hydrochlorothiazide 25 MG tablet   Oral   Take  25 mg by mouth daily. For high blood pressure          . meclizine (ANTIVERT) 25 MG tablet   Oral   Take 25 mg by mouth every 8 (eight) hours as needed (vertigo). One tab q 8 hours as needed vertigo         . meloxicam (MOBIC) 7.5 MG tablet   Oral   Take 7.5 mg by mouth daily. For arthritis           BP 129/79  Pulse 79  Temp(Src) 98.6 F (37 C) (Oral)  Resp 16  SpO2 100% Physical Exam  Nursing note and vitals reviewed. Constitutional: She appears well-developed and well-nourished. No distress.  HENT:  Head: Normocephalic and atraumatic.  Neck: Neck supple.  Pulmonary/Chest: Effort normal.  Neurological: She is alert.  Skin: She is not diaphoretic.       ED Course  Procedures (including critical care time) Labs Review Labs Reviewed  CBC WITH DIFFERENTIAL - Abnormal; Notable for the following:    WBC 15.2 (*)    HCT 35.2 (*)    Platelets 405 (*)    Neutro Abs 11.0 (*)    Monocytes Absolute 1.1 (*)    All other components within normal limits  COMPREHENSIVE METABOLIC PANEL - Abnormal; Notable for the following:    Potassium 3.1 (*)  Albumin 2.8 (*)    Alkaline Phosphatase 121 (*)    GFR calc non Af Amer 57 (*)    GFR calc Af Amer 66 (*)    All other components within normal limits  WOUND CULTURE  CBC  BASIC METABOLIC PANEL   Imaging Review No results found.  EKG Interpretation   None       MDM   1. Cellulitis   2. Cellulitis and abscess of leg, except foot    Patient with cellulitis of left shin with ulceration that has serous drainage.  I placed a bedside ultrasound over the area and saw no accumulation of fluid.  I did use 3 cc of 2% lidocaine with epi and explored the ulceration. To see if there was any purulence that might come out with breaking up of any loculations. As I suspected there were no loculations and no purulent discharge. I marked the area of erythema. Patient was started on vancomycin. [facility]  Dr. Julian ReilGardner.    GascoyneEmily Mayo Faulk, PA-C 06/13/13 0121

## 2013-06-13 ENCOUNTER — Inpatient Hospital Stay (HOSPITAL_COMMUNITY): Payer: Medicare Other

## 2013-06-13 ENCOUNTER — Encounter (HOSPITAL_COMMUNITY): Payer: Self-pay | Admitting: General Practice

## 2013-06-13 DIAGNOSIS — L02419 Cutaneous abscess of limb, unspecified: Secondary | ICD-10-CM | POA: Diagnosis not present

## 2013-06-13 DIAGNOSIS — E876 Hypokalemia: Secondary | ICD-10-CM | POA: Diagnosis not present

## 2013-06-13 LAB — BASIC METABOLIC PANEL
BUN: 21 mg/dL (ref 6–23)
CO2: 29 mEq/L (ref 19–32)
Calcium: 8.9 mg/dL (ref 8.4–10.5)
Chloride: 97 mEq/L (ref 96–112)
Creatinine, Ser: 1.07 mg/dL (ref 0.50–1.10)
GFR calc Af Amer: 66 mL/min — ABNORMAL LOW (ref >90)
GFR calc non Af Amer: 57 mL/min — ABNORMAL LOW (ref >90)
GLUCOSE: 102 mg/dL — AB (ref 70–99)
Potassium: 3.1 mEq/L — ABNORMAL LOW (ref 3.7–5.3)
SODIUM: 140 meq/L (ref 137–147)

## 2013-06-13 LAB — CBC
HEMATOCRIT: 31.7 % — AB (ref 36.0–46.0)
Hemoglobin: 10.6 g/dL — ABNORMAL LOW (ref 12.0–15.0)
MCH: 29.9 pg (ref 26.0–34.0)
MCHC: 33.4 g/dL (ref 30.0–36.0)
MCV: 89.5 fL (ref 78.0–100.0)
Platelets: 380 10*3/uL (ref 150–400)
RBC: 3.54 MIL/uL — AB (ref 3.87–5.11)
RDW: 13.8 % (ref 11.5–15.5)
WBC: 13.4 10*3/uL — ABNORMAL HIGH (ref 4.0–10.5)

## 2013-06-13 MED ORDER — TRAMADOL-ACETAMINOPHEN 37.5-325 MG PO TABS
1.0000 | ORAL_TABLET | Freq: Four times a day (QID) | ORAL | Status: DC | PRN
  Administered 2013-06-13: 1 via ORAL
  Filled 2013-06-13: qty 1

## 2013-06-13 MED ORDER — POTASSIUM CHLORIDE CRYS ER 20 MEQ PO TBCR
40.0000 meq | EXTENDED_RELEASE_TABLET | Freq: Once | ORAL | Status: AC
  Administered 2013-06-13: 40 meq via ORAL
  Filled 2013-06-13: qty 2

## 2013-06-13 MED ORDER — GADOBENATE DIMEGLUMINE 529 MG/ML IV SOLN
20.0000 mL | Freq: Once | INTRAVENOUS | Status: AC
  Administered 2013-06-13: 23:00:00 20 mL via INTRAVENOUS

## 2013-06-13 MED ORDER — TRAMADOL-ACETAMINOPHEN 37.5-325 MG PO TABS
2.0000 | ORAL_TABLET | Freq: Four times a day (QID) | ORAL | Status: DC | PRN
  Administered 2013-06-14 – 2013-06-15 (×3): 2 via ORAL
  Filled 2013-06-13 (×3): qty 2

## 2013-06-13 MED ORDER — SODIUM CHLORIDE 0.9 % IV SOLN
Freq: Once | INTRAVENOUS | Status: AC
  Administered 2013-06-13: 10:00:00 via INTRAVENOUS

## 2013-06-13 MED ORDER — DIPHENHYDRAMINE HCL 25 MG PO CAPS
ORAL_CAPSULE | ORAL | Status: AC
  Filled 2013-06-13: qty 1

## 2013-06-13 MED ORDER — DIPHENHYDRAMINE HCL 25 MG PO CAPS
25.0000 mg | ORAL_CAPSULE | Freq: Once | ORAL | Status: AC
  Administered 2013-06-13: 25 mg via ORAL

## 2013-06-13 MED ORDER — HYDROCODONE-ACETAMINOPHEN 5-325 MG PO TABS
1.0000 | ORAL_TABLET | Freq: Once | ORAL | Status: AC
  Administered 2013-06-13: 1 via ORAL
  Filled 2013-06-13: qty 1

## 2013-06-13 NOTE — ED Notes (Signed)
Pt is here with LLE abscess and swelling, pulse present.  Pt states this started around Christmas.  Pt has had IV abx and awaiting medical bed.  Pt has family at bedside and no complaints at this time

## 2013-06-13 NOTE — Progress Notes (Signed)
UR completed. Yasmen Cortner RN CCM Case Mgmt 

## 2013-06-13 NOTE — Progress Notes (Signed)
Subjective: Patient still with significant pain induration and drainage from left leg. She is without fever chills, labs revealed leukocytosis.   Objective: Vital signs in last 24 hours: Temp:  [97.9 F (36.6 C)-98.6 F (37 C)] 98.3 F (36.8 C) (01/06 1054) Pulse Rate:  [60-88] 77 (01/06 1200) Resp:  [14-20] 16 (01/06 1054) BP: (90-136)/(43-84) 111/84 mmHg (01/06 1200) SpO2:  [96 %-100 %] 100 % (01/06 1200) Weight:  [93.895 kg (207 lb)] 93.895 kg (207 lb) (01/05 2105) Weight change:     Intake/Output from previous day:   Intake/Output this shift:    General appearance: alert and cooperative Resp: clear to auscultation bilaterally Cardio: regular rate and rhythm, S1, S2 normal, no murmur, click, rub or gallop Extremities: left anterior lower leg with induration tenderness and drainage. There is some warmth to the area but more induration and exquisite tenderness along the gastrocnemius  Lab Results:  Results for orders placed during the hospital encounter of 06/12/13 (from the past 24 hour(s))  CBC WITH DIFFERENTIAL     Status: Abnormal   Collection Time    06/12/13  4:00 PM      Result Value Range   WBC 15.2 (*) 4.0 - 10.5 K/uL   RBC 3.94  3.87 - 5.11 MIL/uL   Hemoglobin 12.0  12.0 - 15.0 g/dL   HCT 38.735.2 (*) 56.436.0 - 33.246.0 %   MCV 89.3  78.0 - 100.0 fL   MCH 30.5  26.0 - 34.0 pg   MCHC 34.1  30.0 - 36.0 g/dL   RDW 95.113.7  88.411.5 - 16.615.5 %   Platelets 405 (*) 150 - 400 K/uL   Neutrophils Relative % 72  43 - 77 %   Neutro Abs 11.0 (*) 1.7 - 7.7 K/uL   Lymphocytes Relative 18  12 - 46 %   Lymphs Abs 2.7  0.7 - 4.0 K/uL   Monocytes Relative 7  3 - 12 %   Monocytes Absolute 1.1 (*) 0.1 - 1.0 K/uL   Eosinophils Relative 3  0 - 5 %   Eosinophils Absolute 0.4  0.0 - 0.7 K/uL   Basophils Relative 0  0 - 1 %   Basophils Absolute 0.1  0.0 - 0.1 K/uL  COMPREHENSIVE METABOLIC PANEL     Status: Abnormal   Collection Time    06/12/13  4:00 PM      Result Value Range   Sodium 143   137 - 147 mEq/L   Potassium 3.1 (*) 3.7 - 5.3 mEq/L   Chloride 100  96 - 112 mEq/L   CO2 30  19 - 32 mEq/L   Glucose, Bld 84  70 - 99 mg/dL   BUN 21  6 - 23 mg/dL   Creatinine, Ser 0.631.07  0.50 - 1.10 mg/dL   Calcium 9.2  8.4 - 01.610.5 mg/dL   Total Protein 8.0  6.0 - 8.3 g/dL   Albumin 2.8 (*) 3.5 - 5.2 g/dL   AST 17  0 - 37 U/L   ALT 12  0 - 35 U/L   Alkaline Phosphatase 121 (*) 39 - 117 U/L   Total Bilirubin 0.3  0.3 - 1.2 mg/dL   GFR calc non Af Amer 57 (*) >90 mL/min   GFR calc Af Amer 66 (*) >90 mL/min  WOUND CULTURE     Status: None   Collection Time    06/13/13  1:35 AM      Result Value Range   Specimen Description WOUND LEFT LEG  Special Requests NONE     Gram Stain       Value: NO WBC SEEN     NO SQUAMOUS EPITHELIAL CELLS SEEN     NO ORGANISMS SEEN     Performed at [facility]   Culture PENDING     Report Status PENDING    CBC     Status: Abnormal   Collection Time    06/13/13  3:15 AM      Result Value Range   WBC 13.4 (*) 4.0 - 10.5 K/uL   RBC 3.54 (*) 3.87 - 5.11 MIL/uL   Hemoglobin 10.6 (*) 12.0 - 15.0 g/dL   HCT 40.9 (*) 81.1 - 91.4 %   MCV 89.5  78.0 - 100.0 fL   MCH 29.9  26.0 - 34.0 pg   MCHC 33.4  30.0 - 36.0 g/dL   RDW 78.2  95.6 - 21.3 %   Platelets 380  150 - 400 K/uL  BASIC METABOLIC PANEL     Status: Abnormal   Collection Time    06/13/13  3:15 AM      Result Value Range   Sodium 140  137 - 147 mEq/L   Potassium 3.1 (*) 3.7 - 5.3 mEq/L   Chloride 97  96 - 112 mEq/L   CO2 29  19 - 32 mEq/L   Glucose, Bld 102 (*) 70 - 99 mg/dL   BUN 21  6 - 23 mg/dL   Creatinine, Ser 0.86  0.50 - 1.10 mg/dL   Calcium 8.9  8.4 - 57.8 mg/dL   GFR calc non Af Amer 57 (*) >90 mL/min   GFR calc Af Amer 66 (*) >90 mL/min      Studies/Results: No results found.  Medications:  Prior to Admission:  (Not in a hospital admission) Scheduled: . fluticasone  2 spray Each Nare Daily  . heparin  5,000 Units Subcutaneous Q8H  . hydrochlorothiazide  25  mg Oral Daily  . meloxicam  7.5 mg Oral Daily   Continuous: . vancomycin 1,000 mg (06/13/13 4696)   EXB:[id number]  Assessment/Plan: Left leg cellulitis/abscess, rule out myositis patient has significant white count, tenderness and induration. Imaging will be obtained to get a better idea the extent of the abscess I&D may be indicated. For now continue current antibiotics History of hypertension, current BP was a little low. We will hold BP medication at this time Hypokalemia probably secondary to HCTZ, replete orally   LOS: 1 day   Shizuo Biskup D 06/13/2013, 12:47 PM

## 2013-06-13 NOTE — Progress Notes (Signed)
NURSING PROGRESS NOTE  Helen Brown [id number] Admission Data: 06/13/2013 3:02 PM Attending Provider: Katy Apoonald D Polite, MD WUJ:WJXBJY,NWGNFAPCP:POLITE,RONALD D, MD Code Status: full  Helen Brown is a 58 y.o. female patient admitted from ED:  -No acute distress noted.  -No complaints of shortness of breath.  -No complaints of chest pain.    Blood pressure 115/65, pulse 68, temperature 98.3 F (36.8 C), temperature source Oral, resp. rate 20, height 5' (1.524 m), weight 93.895 kg (207 lb), SpO2 100.00%.   IV Fluids:  IV in place, occlusive dsg intact without redness, IV cath antecubital left, condition patent and no redness normal saline.   Allergies:  Codeine  Past Medical History:   has a past medical history of Hypertension.  Past Surgical History:   has no past surgical history on file.  Social History:   reports that she has never smoked. She does not have any smokeless tobacco history on file. She reports that she does not drink alcohol or use illicit drugs.  Skin: cellulitis to left lower leg.  Patient/Family orientated to room. Information packet given to patient/family. Admission inpatient armband information verified with patient/family to include name and date of birth and placed on patient arm. Side rails up x 2, fall assessment and education completed with patient/family. Patient/family able to verbalize understanding of risk associated with falls and verbalized understanding to call for assistance before getting out of bed. Call light within reach. Patient/family able to voice and demonstrate understanding of unit orientation instructions.    Will continue to evaluate and treat per MD orders.

## 2013-06-13 NOTE — Progress Notes (Signed)
Pt complaining of throbbing pain 9/10 in her left leg. Dr. Pete GlatterStoneking notified, ultracet ordered.

## 2013-06-13 NOTE — ED Notes (Signed)
Reviewed with Dr. Blinda LeatherwoodPollina over pt allergy to codeine, pt states she gets rash over parts of body when she takes codeine, Dr. Blinda LeatherwoodPollina allowed Norco to be given. Pt instructed to call if she feels itchy or any other symptoms.

## 2013-06-13 NOTE — ED Notes (Signed)
Spoke with Dr. Nehemiah SettlePolite regarding patients B/p , Orders placed for normal saline and to hold patients B/p meds

## 2013-06-13 NOTE — ED Notes (Signed)
Pt states she is very itchy

## 2013-06-14 DIAGNOSIS — E876 Hypokalemia: Secondary | ICD-10-CM | POA: Diagnosis not present

## 2013-06-14 DIAGNOSIS — L02419 Cutaneous abscess of limb, unspecified: Secondary | ICD-10-CM | POA: Diagnosis not present

## 2013-06-14 MED ORDER — ONDANSETRON HCL 4 MG/2ML IJ SOLN
4.0000 mg | Freq: Four times a day (QID) | INTRAMUSCULAR | Status: DC | PRN
  Administered 2013-06-14 – 2013-06-16 (×2): 4 mg via INTRAVENOUS
  Filled 2013-06-14 (×2): qty 2

## 2013-06-14 MED ORDER — HYDROMORPHONE HCL PF 1 MG/ML IJ SOLN
1.0000 mg | Freq: Three times a day (TID) | INTRAMUSCULAR | Status: DC | PRN
  Administered 2013-06-14: 1 mg via INTRAVENOUS
  Filled 2013-06-14: qty 1

## 2013-06-14 NOTE — Progress Notes (Signed)
Pt complaining of nausea and vomiting after receiving 1mg  of IV dilaudid at 1807. Dr. Donette LarryHusain notified, new orders given  to discontinue dilaudid and to start 4mg  of IV zofran Q6 PRN. Zofran administered to patient. Will continue to monitor.

## 2013-06-14 NOTE — Progress Notes (Signed)
Pt family in visiting with small child, visiting family counseled about policy during flu season and concerns for children being exposed to flu, family states that child has received her flu shot and that they did not see any signs or know of policy, RN reiterated policy for family with understanding, stated that they would "not visit long".

## 2013-06-14 NOTE — Progress Notes (Signed)
Subjective: Patient without fever or chills however still has significant leg pain. MRI review with radiologist, fortunately no abscess, no myositis. She does have a lot of induration and tenderness with a little palpation. Cultures showed staph  Objective: Vital signs in last 24 hours: Temp:  [97.8 F (36.6 C)-98.6 F (37 C)] 97.8 F (36.6 C) (01/07 0521) Pulse Rate:  [68-78] 68 (01/07 0521) Resp:  [20] 20 (01/07 0521) BP: (91-121)/(63-81) 116/76 mmHg (01/07 0521) SpO2:  [93 %-100 %] 93 % (01/07 0521) Weight:  [96.7 kg (213 lb 3 oz)] 96.7 kg (213 lb 3 oz) (01/06 1510) Weight change: 2.805 kg (6 lb 3 oz) Last BM Date: 06/12/13  Intake/Output from previous day:   Intake/Output this shift: Total I/O In: 240 [P.O.:240] Out: -   General appearance: alert and cooperative Resp: clear to auscultation bilaterally Cardio: regular rate and rhythm, S1, S2 normal, no murmur, click, rub or gallop Extremities: left leg swelling drainage tenderness to palpation in the calf in the area of induration.  Lab Results:  No results found for this or any previous visit (from the past 24 hour(s)).    Studies/Results: Mr Tibia Fibula Left W Wo Contrast  06/14/2013   CLINICAL DATA:  Left lower leg pain, swelling and drainage.  EXAM: MRI OF LOWER LEFT EXTREMITY WITHOUT AND WITH CONTRAST  TECHNIQUE: Multiplanar, multisequence MR imaging of the left lower leg was performed both before and after administration of intravenous contrast.  CONTRAST:  20mL MULTIHANCE GADOBENATE DIMEGLUMINE 529 MG/ML IV SOLN  COMPARISON:  None.  FINDINGS: Examination was performed with the body coil. Both lower legs are included on the axial and coronal images.  There is asymmetric enlargement of the left lower leg which demonstrates diffuse subcutaneous edema and ill-defined fluid. There is a possible small draining tract anteriorly, best seen on the sagittal images. The underlying musculature appears normal without edema or  abnormal enhancement.  No vascular abnormalities are demonstrated. There is no evidence of osteomyelitis of the tibia or fibula.  No significant findings are identified within the right lower leg.  IMPRESSION: 1. Findings are consistent with left lower leg cellulitis. 2. No evidence of myositis or deep abscess. 3. No evidence of osteomyelitis.   Electronically Signed   By: Roxy HorsemanBill  Veazey M.D.   On: 06/14/2013 08:27    Medications:  Prior to Admission:  Prescriptions prior to admission  Medication Sig Dispense Refill  . doxycycline (VIBRAMYCIN) 100 MG capsule Take 100 mg by mouth daily.      . fluticasone (FLONASE) 50 MCG/ACT nasal spray Place 2 sprays into the nose daily. 2 sprays each nostril daily for allergies/congestion  16 g  0  . hydrochlorothiazide 25 MG tablet Take 25 mg by mouth daily. For high blood pressure       . meclizine (ANTIVERT) 25 MG tablet Take 25 mg by mouth every 8 (eight) hours as needed (vertigo). One tab q 8 hours as needed vertigo      . meloxicam (MOBIC) 7.5 MG tablet Take 7.5 mg by mouth daily. For arthritis        Scheduled: . fluticasone  2 spray Each Nare Daily  . heparin  5,000 Units Subcutaneous Q8H  . meloxicam  7.5 mg Oral Daily  . vancomycin  1,000 mg Intravenous Q12H   Continuous:  JXB:[id number]:meclizine, traMADol-acetaminophen  Assessment/Plan: Left leg cellulitis, fortunately no myositis or abscess that requires drainage, continue antibiotics,DVT prophylaxis followup wound culture. History of hypertension Mild hypokalemia repleted orally   LOS:  2 days   Oona Trammel D 06/14/2013, 12:57 PM

## 2013-06-15 DIAGNOSIS — E876 Hypokalemia: Secondary | ICD-10-CM | POA: Diagnosis not present

## 2013-06-15 DIAGNOSIS — L02419 Cutaneous abscess of limb, unspecified: Secondary | ICD-10-CM | POA: Diagnosis not present

## 2013-06-15 LAB — WOUND CULTURE: Gram Stain: NONE SEEN

## 2013-06-15 LAB — BASIC METABOLIC PANEL
BUN: 17 mg/dL (ref 6–23)
CALCIUM: 9.2 mg/dL (ref 8.4–10.5)
CHLORIDE: 103 meq/L (ref 96–112)
CO2: 25 mEq/L (ref 19–32)
CREATININE: 0.81 mg/dL (ref 0.50–1.10)
GFR calc non Af Amer: 80 mL/min — ABNORMAL LOW (ref >90)
Glucose, Bld: 116 mg/dL — ABNORMAL HIGH (ref 70–99)
Potassium: 4.2 mEq/L (ref 3.7–5.3)
Sodium: 142 mEq/L (ref 137–147)

## 2013-06-15 LAB — CBC
HEMATOCRIT: 29.5 % — AB (ref 36.0–46.0)
Hemoglobin: 9.9 g/dL — ABNORMAL LOW (ref 12.0–15.0)
MCH: 30.4 pg (ref 26.0–34.0)
MCHC: 33.6 g/dL (ref 30.0–36.0)
MCV: 90.5 fL (ref 78.0–100.0)
Platelets: 380 10*3/uL (ref 150–400)
RBC: 3.26 MIL/uL — ABNORMAL LOW (ref 3.87–5.11)
RDW: 14.4 % (ref 11.5–15.5)
WBC: 9.7 10*3/uL (ref 4.0–10.5)

## 2013-06-15 NOTE — Progress Notes (Signed)
Patient Has positive MRSA in wound culture contact precaution initiated.

## 2013-06-15 NOTE — Consult Note (Signed)
WOC wound consult note Reason for Consult: Consult requested for left leg wound.  Pt previously had cellulitis to left calf which was marked and has receded at this time.  Daughter at bedside states they "drained a pocket of fluid" in the ER. Wound type: Full thickness Measurement:.2X.2X.2cm Wound ZOX:WRUEbed:dark red Drainage (amount, consistency, odor) mod amt yellow drainage, no odor Periwound: Intact skin surrounding Dressing procedure/placement/frequency: Aquacel to absorb drainage and provide antimicrobial benefits. Foam dressing to protect from further injury. Discussed topical treatment after discharge with pt and daughter.  Appear to understand. Pt plans to discharge tomorrow according to progress notes.  1 sheet of Aquacel and 2 foam dressings given to pt; this should be adequate supplies for a week and at that time pt can use neosporin and gauze if wound has improved. Please re-consult if further assistance is needed.  Thank-you,  Cammie Mcgeeawn Wyn Nettle MSN, RN, CWOCN, [place]CN-AP, CNS [phone]

## 2013-06-15 NOTE — Progress Notes (Signed)
ANTIBIOTIC CONSULT NOTE - FOLLOW UP  Pharmacy Consult for Vancomycin Indication: LLE cellulitis  Allergies  Allergen Reactions  . Codeine Rash    Patient Measurements: Height: 5' (152.4 cm) Weight: 213 lb 3 oz (96.7 kg) IBW/kg (Calculated) : 45.5 Adjusted Body Weight:   Vital Signs: Temp: 97.8 F (36.6 C) (01/08 0536) Temp src: Oral (01/08 0536) BP: 119/72 mmHg (01/08 0536) Pulse Rate: 55 (01/08 0536) Intake/Output from previous day: 01/07 0701 - 01/08 0700 In: 840 [P.O.:840] Out: 400 [Urine:400] Intake/Output from this shift: Total I/O In: 120 [P.O.:120] Out: -   Labs:  Recent Labs  06/12/13 1600 06/13/13 0315 06/15/13 0645  WBC 15.2* 13.4* 9.7  HGB 12.0 10.6* 9.9*  PLT 405* 380 380  CREATININE 1.07 1.07 0.81   Estimated Creatinine Clearance: 80.8 ml/min (by C-G formula based on Cr of 0.81). No results found for this basename: VANCOTROUGH, Leodis BinetVANCOPEAK, VANCORANDOM, GENTTROUGH, GENTPEAK, GENTRANDOM, TOBRATROUGH, TOBRAPEAK, TOBRARND, AMIKACINPEAK, AMIKACINTROU, AMIKACIN,  in the last 72 hours   Microbiology: Recent Results (from the past 720 hour(s))  WOUND CULTURE     Status: None   Collection Time    06/13/13  1:35 AM      Result Value Range Status   Specimen Description WOUND LEFT LEG   Final   Special Requests NONE   Final   Gram Stain     Final   Value: NO WBC SEEN     NO SQUAMOUS EPITHELIAL CELLS SEEN     NO ORGANISMS SEEN     Performed at [facility]   Culture     Final   Value: MODERATE METHICILLIN RESISTANT STAPHYLOCOCCUS AUREUS     Note: RIFAMPIN AND GENTAMICIN SHOULD NOT BE USED AS SINGLE DRUGS FOR TREATMENT OF STAPH INFECTIONS. This organism DOES NOT demonstrate inducible Clindamycin resistance in vitro. CRITICAL RESULT CALLED TO, READ BACK BY AND VERIFIED WITH: S LEE 06/15/13 AT      815 AM BY [facility]     Performed at [facility]   Report Status 06/15/2013 FINAL   Final   Organism ID, Bacteria METHICILLIN RESISTANT  STAPHYLOCOCCUS AUREUS   Final    Anti-infectives   Start     Dose/Rate Route Frequency Ordered Stop   06/13/13 0600  vancomycin (VANCOCIN) IVPB 1000 mg/200 mL premix     1,000 mg 200 mL/hr over 60 Minutes Intravenous Every 12 hours 06/12/13 2217     06/12/13 2100  vancomycin (VANCOCIN) IVPB 1000 mg/200 mL premix     1,000 mg 200 mL/hr over 60 Minutes Intravenous  Once 06/12/13 2057 06/12/13 2234   06/12/13 2030  clindamycin (CLEOCIN) IVPB 600 mg  Status:  Discontinued     600 mg 100 mL/hr over 30 Minutes Intravenous  Once 06/12/13 2029 06/12/13 2032      Assessment: 58yof on Vancomycin Day 4 for LLE cellulitis. Patient is afebrile and WBC have trended down to 9.7. Wound culture has now reported MRSA susceptible to clindamycin, tetracyclines, Bactrim and Vancomycin. SCr has improved since admission. Continue Vancomycin 1g IV q12h for now and plan to check trough tomorrow afternoon if continued.  Goal of Therapy:  Vancomycin trough level 10-15 mcg/ml  Plan:  1. Continue Vancomycin 1g IV q12h - will check trough on 1/9 if continued 2. Consider narrowing antibiotic based on culture results if clinically appropriate  Cleon DewDulaney, Orrtanna Robert [phone] 06/15/2013,11:46 AM

## 2013-06-15 NOTE — Care Management Note (Signed)
    Page 1 of 1   06/16/2013     3:20:31 PM   CARE MANAGEMENT NOTE 06/16/2013  Patient:  Helen Brown,Helen Brown   Account Number:  [id number]  Date Initiated:  06/15/2013  Documentation initiated by:  Letha CapeAYLOR,Iyannah Blake  Subjective/Objective Assessment:   dx cellulitis and abscess of leg  admit- lives with spouse. Patient has family available 24 hrs/day.     Action/Plan:   pt eval- no pt follow up needed.   Anticipated DC Date:  06/16/2013   Anticipated DC Plan:  HOME/SELF CARE      DC Planning Services  CM consult      Choice offered to / List presented to:             Status of service:  Completed, signed off Medicare Important Message given?   (If response is "NO", the following Medicare IM given date fields will be blank) Date Medicare IM given:   Date Additional Medicare IM given:    Discharge Disposition:  HOME/SELF CARE  Per UR Regulation:  Reviewed for med. necessity/level of care/duration of stay  If discussed at Long Length of Stay Meetings, dates discussed:    Comments:  06/16/13 15:19 Letha Capeeborah Justina Bertini RN, BSN [phone] patient dc to home, No NCM referral, no needs anticipated.  06/15/13 16:40 Letha Capeeborah Khya Halls RN ,BSN [phone] patient lives with spouse, per physical therapy patien thas no pt follow up.  NCM will continue to follow for dc needs.

## 2013-06-15 NOTE — Evaluation (Signed)
Physical Therapy Evaluation Patient Details Name: Helen RohrerSylvia M Brown MRN: [id number] DOB: 03/29/1956 Today's Date: 06/15/2013 Time: [id number] PT Time Calculation (min): 16 min  PT Assessment / Plan / Recommendation History of Present Illness  Pt is a 58 y.o. female adm due to Lt LE cellulitis.   Clinical Impression  Pt adm due to the above. Presents with limitations in independence with functional mobility secondary to deficits indicated below (see PT problem list). Pt to benefit from acute skilled PT to address deficits listed below and increase independence with mobility prior to returning home. Pt will have 24/7 (A) upon returning home.     PT Assessment  Patient needs continued PT services    Follow Up Recommendations  No PT follow up;Supervision/Assistance - 24 hour    Does the patient have the potential to tolerate intense rehabilitation      Barriers to Discharge        Equipment Recommendations  None recommended by PT    Recommendations for Other Services     Frequency Min 3X/week    Precautions / Restrictions Precautions Precautions: Fall Restrictions Weight Bearing Restrictions: No   Pertinent Vitals/Pain 9/10 with WB through Lt LE       Mobility  Bed Mobility Overal bed mobility: Modified Independent General bed mobility comments: HOB elevated Transfers Overall transfer level: Needs assistance Equipment used: 1 person hand held assist Transfers: Sit to/from Stand Sit to Stand: Min assist General transfer comment: (A) to maintain balance with transfers due to decr ability to fully WB through Lt LE secondary to pain; pt using UE support to off weight Lt LE  Ambulation/Gait Ambulation/Gait assistance: Min assist Ambulation Distance (Feet): 18 Feet Assistive device: 2 person hand held assist Gait Pattern/deviations: Antalgic;Wide base of support;Decreased stance time - left;Decreased step length - right Gait velocity: decr Gait velocity interpretation: <1.8  ft/sec, indicative of risk for recurrent falls General Gait Details: pt using bil UE suppot due to pain with WB on Lt Le; would benefit from RW for ambulation; cues for sequencing and upright posture    Exercises General Exercises - Lower Extremity Ankle Circles/Pumps: AROM;Both;10 reps;Seated   PT Diagnosis: Abnormality of gait;Acute pain  PT Problem List: Decreased balance;Decreased mobility;Decreased knowledge of use of DME;Pain PT Treatment Interventions: DME instruction;Gait training;Stair training;Functional mobility training;Therapeutic activities;Therapeutic exercise;Balance training;Neuromuscular re-education;Patient/family education     PT Goals(Current goals can be found in the care plan section) Acute Rehab PT Goals Patient Stated Goal: to not have pain PT Goal Formulation: With patient Time For Goal Achievement: 06/22/13 Potential to Achieve Goals: Good  Visit Information  Last PT Received On: 06/15/13 Assistance Needed: +1 History of Present Illness: Pt is a 58 y.o. female adm due to Lt LE cellulitis.        Prior Functioning  Home Living Family/patient expects to be discharged to:: Private residence Living Arrangements: Spouse/significant other Available Help at Discharge: Family;Available 24 hours/day;Other (Comment) (if needed) Type of Home: House Home Access: Stairs to enter Enter[employer] CorporationEntrance Stairs-Number of Steps: 3 Entrance Stairs-Rails: None Home Layout: One level Home Equipment: Walker - 2 wheels;Cane - single point;Bedside commode Prior Function Level of Independence: Independent Comments: pt reported she began using SPC recently due to pain in Lt LE  Communication Communication: No difficulties    Cognition  Cognition Arousal/Alertness: Awake/alert Behavior During Therapy: WFL for tasks assessed/performed Overall Cognitive Status: Within Functional Limits for tasks assessed    Extremity/Trunk Assessment Upper Extremity Assessment Upper Extremity  Assessment: Overall WFL for tasks assessed  Lower Extremity Assessment Lower Extremity Assessment: LLE deficits/detail LLE: Unable to fully assess due to pain Cervical / Trunk Assessment Cervical / Trunk Assessment: Normal   Balance Balance Overall balance assessment: Needs assistance Sitting-balance support: Bilateral upper extremity supported;Feet supported Sitting balance-Leahy Scale: Normal Standing balance support: Bilateral upper extremity supported;During functional activity Standing balance-Leahy Scale: Fair  End of Session PT - End of Session Equipment Utilized During Treatment: Gait belt Activity Tolerance: Patient tolerated treatment well Patient left: in chair;with call bell/phone within reach;with family/visitor present Nurse Communication: Mobility status  GP     Donell Sievert, Allison [phone] 06/15/2013, 4:25 PM

## 2013-06-15 NOTE — Progress Notes (Signed)
Subjective: Patient doing well remains afebrile. Culture shows MRSA sensitive to Bactrim tetracycline and clindamycin vancomycin. MRI without abscess.  Objective: Vital signs in last 24 hours: Temp:  [97.5 F (36.4 C)-99.2 F (37.3 C)] 97.8 F (36.6 C) (01/08 0536) Pulse Rate:  [55-76] 55 (01/08 0536) Resp:  [18-20] 20 (01/08 0536) BP: (90-119)/(56-72) 119/72 mmHg (01/08 0536) SpO2:  [94 %-99 %] 96 % (01/08 0536) Weight change:  Last BM Date: 06/14/13  Intake/Output from previous day: 01/07 0701 - 01/08 0700 In: 840 [P.O.:840] Out: 400 [Urine:400] Intake/Output this shift: Total I/O In: 120 [P.O.:120] Out: -   General appearance: alert and cooperative Resp: clear to auscultation bilaterally Cardio: regular rate and rhythm, S1, S2 normal, no murmur, click, rub or gallop Extremities: decreased induration decreased drainage left anterior shin. No warmth, 2+ pulses  Lab Results:  Results for orders placed during the hospital encounter of 06/12/13 (from the past 24 hour(s))  BASIC METABOLIC PANEL     Status: Abnormal   Collection Time    06/15/13  6:45 AM      Result Value Range   Sodium 142  137 - 147 mEq/L   Potassium 4.2  3.7 - 5.3 mEq/L   Chloride 103  96 - 112 mEq/L   CO2 25  19 - 32 mEq/L   Glucose, Bld 116 (*) 70 - 99 mg/dL   BUN 17  6 - 23 mg/dL   Creatinine, Ser 1.61  0.50 - 1.10 mg/dL   Calcium 9.2  8.4 - 09.6 mg/dL   GFR calc non Af Amer 80 (*) >90 mL/min   GFR calc Af Amer >90  >90 mL/min  CBC     Status: Abnormal   Collection Time    06/15/13  6:45 AM      Result Value Range   WBC 9.7  4.0 - 10.5 K/uL   RBC 3.26 (*) 3.87 - 5.11 MIL/uL   Hemoglobin 9.9 (*) 12.0 - 15.0 g/dL   HCT 04.5 (*) 40.9 - 81.1 %   MCV 90.5  78.0 - 100.0 fL   MCH 30.4  26.0 - 34.0 pg   MCHC 33.6  30.0 - 36.0 g/dL   RDW 91.4  78.2 - 95.6 %   Platelets 380  150 - 400 K/uL      Studies/Results: Mr Tibia Fibula Left W Wo Contrast  06/14/2013   CLINICAL DATA:  Left lower leg  pain, swelling and drainage.  EXAM: MRI OF LOWER LEFT EXTREMITY WITHOUT AND WITH CONTRAST  TECHNIQUE: Multiplanar, multisequence MR imaging of the left lower leg was performed both before and after administration of intravenous contrast.  CONTRAST:  20mL MULTIHANCE GADOBENATE DIMEGLUMINE 529 MG/ML IV SOLN  COMPARISON:  None.  FINDINGS: Examination was performed with the body coil. Both lower legs are included on the axial and coronal images.  There is asymmetric enlargement of the left lower leg which demonstrates diffuse subcutaneous edema and ill-defined fluid. There is a possible small draining tract anteriorly, best seen on the sagittal images. The underlying musculature appears normal without edema or abnormal enhancement.  No vascular abnormalities are demonstrated. There is no evidence of osteomyelitis of the tibia or fibula.  No significant findings are identified within the right lower leg.  IMPRESSION: 1. Findings are consistent with left lower leg cellulitis. 2. No evidence of myositis or deep abscess. 3. No evidence of osteomyelitis.   Electronically Signed   By: Roxy Horseman M.D.   On: 06/14/2013 08:27  Medications:  Prior to Admission:  Prescriptions prior to admission  Medication Sig Dispense Refill  . doxycycline (VIBRAMYCIN) 100 MG capsule Take 100 mg by mouth daily.      . fluticasone (FLONASE) 50 MCG/ACT nasal spray Place 2 sprays into the nose daily. 2 sprays each nostril daily for allergies/congestion  16 g  0  . hydrochlorothiazide 25 MG tablet Take 25 mg by mouth daily. For high blood pressure       . meclizine (ANTIVERT) 25 MG tablet Take 25 mg by mouth every 8 (eight) hours as needed (vertigo). One tab q 8 hours as needed vertigo      . meloxicam (MOBIC) 7.5 MG tablet Take 7.5 mg by mouth daily. For arthritis        Scheduled: . fluticasone  2 spray Each Nare Daily  . heparin  5,000 Units Subcutaneous Q8H  . meloxicam  7.5 mg Oral Daily  . vancomycin  1,000 mg Intravenous  Q12H   Continuous:  ZOX:[id number]:meclizine, ondansetron (ZOFRAN) IV, traMADol-acetaminophen  Assessment/Plan: Left leg cellulitis showing improvement on IV vancomycin. Induration improved. As stated yesterday MRI negative for abscess or myositis. Continue antibiotics IV tomorrow hopefully discharge tomorrow. Patient still with pain requiring analgesia. She is aware that this will take some time to improve.patient's daughter plans to stay with her, we'll ask physical therapy to evaluate to see if assistive device indicated   LOS: 3 days   Hagan Maltz D 06/15/2013, 1:11 PM

## 2013-06-16 DIAGNOSIS — L02419 Cutaneous abscess of limb, unspecified: Secondary | ICD-10-CM | POA: Diagnosis not present

## 2013-06-16 DIAGNOSIS — E876 Hypokalemia: Secondary | ICD-10-CM | POA: Diagnosis not present

## 2013-06-16 MED ORDER — CLINDAMYCIN HCL 300 MG PO CAPS: 300.0000 mg | ORAL_CAPSULE | Freq: Three times a day (TID) | ORAL | Status: AC

## 2013-06-16 MED ORDER — SULFAMETHOXAZOLE-TRIMETHOPRIM 400-80 MG PO TABS: 1.0000 | ORAL_TABLET | Freq: Two times a day (BID) | ORAL | Status: DC

## 2013-06-16 NOTE — Progress Notes (Signed)
Helen RohrerSylvia M Brown to be D/C'd Home per MD order.  Discussed with the patient and all questions fully answered.    Medication List    STOP taking these medications       doxycycline 100 MG capsule  Commonly known as:  VIBRAMYCIN     hydrochlorothiazide 25 MG tablet  Commonly known as:  HYDRODIURIL      TAKE these medications       clindamycin 300 MG capsule  Commonly known as:  CLEOCIN  Take 1 capsule (300 mg total) by mouth 3 (three) times daily.     fluticasone 50 MCG/ACT nasal spray  Commonly known as:  FLONASE  Place 2 sprays into the nose daily. 2 sprays each nostril daily for allergies/congestion     meclizine 25 MG tablet  Commonly known as:  ANTIVERT  Take 25 mg by mouth every 8 (eight) hours as needed (vertigo). One tab q 8 hours as needed vertigo     meloxicam 7.5 MG tablet  Commonly known as:  MOBIC  Take 7.5 mg by mouth daily. For arthritis        VVS, Skin clean, dry and intact without evidence of skin break down, no evidence of skin tears noted. IV catheter discontinued intact. Site without signs and symptoms of complications. Dressing and pressure applied.  An After Visit Summary was printed and given to the patient. Follow up appointments , new prescriptions and medication administration times given. Handouts given and discussed on cellulitis and MRSA. All questions answered Patient escorted via WC, and D/C home via private auto.  Helen Brown, Helen Winkowski Czarnecki, RN 06/16/2013 2:50 PM

## 2013-06-16 NOTE — Discharge Summary (Signed)
Physician Discharge Summary  Patient ID: NATALE BARBA MRN: [id number] DOB/AGE: 56-22-1957 56 y.o.  Admit date: 06/12/2013 Discharge date: 06/16/2013  Admission Diagnoses:  Discharge Diagnoses:  Principal Problem:   Cellulitis and abscess of leg, except foot hypokalemia resolved Hypertension stable off medication Morbid obesity  Discharged Condition: stable  Hospital Course:  Patient presented to the hospital with complaint of left leg pain and swelling. In the ED she was evaluated she had leukocytosis. Admission was necessary for further evaluation and treatment. Patient initially had been on doxycycline outpatient without improvement. In the office she was evaluated there was concern for probable abscess therefore admission was deemed necessary for further evaluation and treatment. The patient states in the emergency room someone tried to drain her leg but there was no fluid. She is admitted to a medical floor bed started on IV vancomycin. Because of exquisite pain induration concern for abscess or myositis MRI was obtained, unfortunately MRI only showed cellulitis. Patient showed improvement on IV vancomycin. There was decreased pain decreased drainage. Physical therapy consult was obtained. Wound care consult was obtained. See full consult for details. At this time patient felt to be medically stable for discharge to home. She will continue with Aquacel to absorb drainage and use a foam dressing.patient's daughter will assist her at home. Patient will be converted to oral antibiotics clindamycin which culture showed sensitivity to.  Consults:   Wound care nurse Significant Diagnostic Studies:Mr Tibia Fibula Left W Wo Contrast  06/14/2013   CLINICAL DATA:  Left lower leg pain, swelling and drainage.  EXAM: MRI OF LOWER LEFT EXTREMITY WITHOUT AND WITH CONTRAST  TECHNIQUE: Multiplanar, multisequence MR imaging of the left lower leg was performed both before and after administration of intravenous  contrast.  CONTRAST:  20mL MULTIHANCE GADOBENATE DIMEGLUMINE 529 MG/ML IV SOLN  COMPARISON:  None.  FINDINGS: Examination was performed with the body coil. Both lower legs are included on the axial and coronal images.  There is asymmetric enlargement of the left lower leg which demonstrates diffuse subcutaneous edema and ill-defined fluid. There is a possible small draining tract anteriorly, best seen on the sagittal images. The underlying musculature appears normal without edema or abnormal enhancement.  No vascular abnormalities are demonstrated. There is no evidence of osteomyelitis of the tibia or fibula.  No significant findings are identified within the right lower leg.  IMPRESSION: 1. Findings are consistent with left lower leg cellulitis. 2. No evidence of myositis or deep abscess. 3. No evidence of osteomyelitis.   Electronically Signed   By: Roxy Horseman M.D.   On: 06/14/2013 08:27    Wound culture showed MRSA  Discharge Exam: Blood pressure 96/68, pulse 56, temperature 97.7 F (36.5 C), temperature source Oral, resp. rate 18, height 5' (1.524 m), weight 96.7 kg (213 lb 3 oz), SpO2 97.00%. General appearance: alert and cooperative Extremities: left leg decrease in induration and tenderness, currently without drainage  Disposition: 01-Home or Self Care     Medication List    STOP taking these medications       doxycycline 100 MG capsule  Commonly known as:  VIBRAMYCIN     hydrochlorothiazide 25 MG tablet  Commonly known as:  HYDRODIURIL      TAKE these medications       clindamycin 300 MG capsule  Commonly known as:  CLEOCIN  Take 1 capsule (300 mg total) by mouth 3 (three) times daily.     fluticasone 50 MCG/ACT nasal spray  Commonly known as:  FLONASE  Place 2 sprays into the nose daily. 2 sprays each nostril daily for allergies/congestion     meclizine 25 MG tablet  Commonly known as:  ANTIVERT  Take 25 mg by mouth every 8 (eight) hours as needed (vertigo). One tab q  8 hours as needed vertigo     meloxicam 7.5 MG tablet  Commonly known as:  MOBIC  Take 7.5 mg by mouth daily. For arthritis           Follow-up Information   Follow up with Katy ApoPOLITE,Luevenia Mcavoy D, MD In 1 week.   Specialty:  Internal Medicine   Contact information:   [address] [place] [address] [phone]       Signed: Katy ApoOLITE,Laith Antonelli D 06/16/2013, 11:19 AM

## 2013-06-16 NOTE — Progress Notes (Signed)
Physical Therapy Treatment Patient Details Name: Rogelia RohrerSylvia M Zaun MRN: [id number] DOB: 10/19/1955 Today's Date: 06/16/2013 Time: [id number] PT Time Calculation (min): 25 min  PT Assessment / Plan / Recommendation  History of Present Illness Pt is a 58 y.o. female adm due to Lt LE cellulitis.    PT Comments   Pt able to complete stairs today with min A using 1 handrail.  Pt's daughter states she feels comfortable assisting pt with mobility and stairs at this level of care.  PT recommended using RW to decrease pain at this time, pt/daughter agree.  Follow Up Recommendations  Supervision/Assistance - 24 hour;No PT follow up     Does the patient have the potential to tolerate intense rehabilitation     Barriers to Discharge        Equipment Recommendations       Recommendations for Other Services    Frequency Min 3X/week   Progress towards PT Goals Progress towards PT goals: Progressing toward goals  Plan Current plan remains appropriate    Precautions / Restrictions Precautions Precautions: Fall Restrictions Weight Bearing Restrictions: No   Pertinent Vitals/Pain Pt c/o 4/10 LE pain with gait, eases with rest    Mobility  Bed Mobility Overal bed mobility: Modified Independent Transfers Equipment used: Rolling walker (2 wheeled) Sit to Stand: Supervision General transfer comment: cues for hand placement Ambulation/Gait Ambulation/Gait assistance: Supervision Ambulation Distance (Feet): 10 Feet (first 10' with SPC, limited due to LE pain, then able to gait 6875' wiht RW before fatigued) General Gait Details: PT recommended using RW at home to decrease pain, pt agrees Stairs: Yes Stairs assistance: Min assist Stair Management: One rail Left Number of Stairs: 3 General stair comments: Pt requires use of rail and +1 HHA.  Pt's daughter present and states she feels she can assist pt with this at home.  Pt with knowledge of proper technique for ascending/descending stairs, no LOB     Exercises       PT Goals (current goals can now be found in the care plan section)    Visit Information  Last PT Received On: 06/16/13 Assistance Needed: +1 History of Present Illness: Pt is a 58 y.o. female adm due to Lt LE cellulitis.     Subjective Data      Cognition  Cognition Arousal/Alertness: Awake/alert Behavior During Therapy: WFL for tasks assessed/performed Overall Cognitive Status: Within Functional Limits for tasks assessed    Balance     End of Session PT - End of Session Equipment Utilized During Treatment: Gait belt Activity Tolerance: Patient limited by pain Patient left: in chair;with call bell/phone within reach;with family/visitor present Nurse Communication: Mobility status   GP     Detrick Dani 06/16/2013, 11:01 AM

## 2013-06-16 NOTE — ED Provider Notes (Signed)
Medical screening examination/treatment/procedure(s) were performed by non-physician practitioner and as supervising physician I was immediately available for consultation/collaboration.  Maitland Lesiak J. Gesselle Fitzsimons, MD 06/16/13 0755 

## 2013-06-23 DIAGNOSIS — L02419 Cutaneous abscess of limb, unspecified: Secondary | ICD-10-CM | POA: Diagnosis not present

## 2013-06-23 DIAGNOSIS — I10 Essential (primary) hypertension: Secondary | ICD-10-CM | POA: Diagnosis not present

## 2013-06-23 DIAGNOSIS — L03119 Cellulitis of unspecified part of limb: Secondary | ICD-10-CM | POA: Diagnosis not present

## 2013-07-05 DIAGNOSIS — L02419 Cutaneous abscess of limb, unspecified: Secondary | ICD-10-CM | POA: Diagnosis not present

## 2013-07-26 DIAGNOSIS — I10 Essential (primary) hypertension: Secondary | ICD-10-CM | POA: Diagnosis not present

## 2013-07-26 DIAGNOSIS — Z6841 Body Mass Index (BMI) 40.0 and over, adult: Secondary | ICD-10-CM | POA: Diagnosis not present

## 2013-07-26 DIAGNOSIS — E7800 Pure hypercholesterolemia, unspecified: Secondary | ICD-10-CM | POA: Diagnosis not present

## 2013-07-26 DIAGNOSIS — E663 Overweight: Secondary | ICD-10-CM | POA: Diagnosis not present

## 2013-08-29 ENCOUNTER — Other Ambulatory Visit: Payer: Self-pay

## 2013-08-29 DIAGNOSIS — Z1231 Encounter for screening mammogram for malignant neoplasm of breast: Secondary | ICD-10-CM

## 2013-10-04 DIAGNOSIS — M545 Low back pain, unspecified: Secondary | ICD-10-CM | POA: Diagnosis not present

## 2013-10-11 ENCOUNTER — Encounter (INDEPENDENT_AMBULATORY_CARE_PROVIDER_SITE_OTHER): Payer: Self-pay

## 2013-10-11 ENCOUNTER — Ambulatory Visit
Admission: RE | Admit: 2013-10-11 | Discharge: 2013-10-11 | Disposition: A | Discharge status: Home / Self Care | Payer: Medicare Other | Source: Ambulatory Visit

## 2013-10-11 DIAGNOSIS — Z1231 Encounter for screening mammogram for malignant neoplasm of breast: Secondary | ICD-10-CM

## 2014-01-24 DIAGNOSIS — M545 Low back pain, unspecified: Secondary | ICD-10-CM | POA: Diagnosis not present

## 2014-01-24 DIAGNOSIS — Z0000 Encounter for general adult medical examination without abnormal findings: Secondary | ICD-10-CM | POA: Diagnosis not present

## 2014-01-24 DIAGNOSIS — Z6841 Body Mass Index (BMI) 40.0 and over, adult: Secondary | ICD-10-CM | POA: Diagnosis not present

## 2014-01-24 DIAGNOSIS — E663 Overweight: Secondary | ICD-10-CM | POA: Diagnosis not present

## 2014-01-24 DIAGNOSIS — E7800 Pure hypercholesterolemia, unspecified: Secondary | ICD-10-CM | POA: Diagnosis not present

## 2014-01-24 DIAGNOSIS — I10 Essential (primary) hypertension: Secondary | ICD-10-CM | POA: Diagnosis not present

## 2014-01-24 DIAGNOSIS — Z1331 Encounter for screening for depression: Secondary | ICD-10-CM | POA: Diagnosis not present

## 2014-01-24 DIAGNOSIS — R5382 Chronic fatigue, unspecified: Secondary | ICD-10-CM | POA: Diagnosis not present

## 2014-01-24 DIAGNOSIS — G9332 Myalgic encephalomyelitis/chronic fatigue syndrome: Secondary | ICD-10-CM | POA: Diagnosis not present

## 2014-01-31 ENCOUNTER — Other Ambulatory Visit: Payer: Self-pay | Admitting: Obstetrics & Gynecology

## 2014-01-31 ENCOUNTER — Other Ambulatory Visit (HOSPITAL_COMMUNITY)
Admission: RE | Admit: 2014-01-31 | Discharge: 2014-01-31 | Disposition: A | Discharge status: Home / Self Care | Payer: Medicare Other | Source: Ambulatory Visit | Attending: Obstetrics & Gynecology | Admitting: Obstetrics & Gynecology

## 2014-01-31 DIAGNOSIS — Z01419 Encounter for gynecological examination (general) (routine) without abnormal findings: Secondary | ICD-10-CM | POA: Diagnosis not present

## 2014-01-31 DIAGNOSIS — Z124 Encounter for screening for malignant neoplasm of cervix: Secondary | ICD-10-CM | POA: Insufficient documentation

## 2014-01-31 DIAGNOSIS — N318 Other neuromuscular dysfunction of bladder: Secondary | ICD-10-CM | POA: Diagnosis not present

## 2014-02-01 LAB — CYTOLOGY - PAP

## 2014-03-14 DIAGNOSIS — M961 Postlaminectomy syndrome, not elsewhere classified: Secondary | ICD-10-CM | POA: Diagnosis not present

## 2014-03-21 DIAGNOSIS — N3281 Overactive bladder: Secondary | ICD-10-CM | POA: Diagnosis not present

## 2014-03-21 DIAGNOSIS — Z6841 Body Mass Index (BMI) 40.0 and over, adult: Secondary | ICD-10-CM | POA: Diagnosis not present

## 2014-05-07 DIAGNOSIS — M109 Gout, unspecified: Secondary | ICD-10-CM | POA: Diagnosis not present

## 2014-05-21 ENCOUNTER — Emergency Department (HOSPITAL_COMMUNITY)
Admission: EM | Admit: 2014-05-21 | Discharge: 2014-05-21 | Disposition: A | Discharge status: Home / Self Care | Payer: Medicare Other | Attending: Emergency Medicine | Admitting: Emergency Medicine

## 2014-05-21 ENCOUNTER — Emergency Department (HOSPITAL_COMMUNITY): Payer: Medicare Other

## 2014-05-21 ENCOUNTER — Encounter (HOSPITAL_COMMUNITY): Payer: Self-pay

## 2014-05-21 DIAGNOSIS — F8081 Childhood onset fluency disorder: Secondary | ICD-10-CM | POA: Diagnosis not present

## 2014-05-21 DIAGNOSIS — M1990 Unspecified osteoarthritis, unspecified site: Secondary | ICD-10-CM | POA: Diagnosis not present

## 2014-05-21 DIAGNOSIS — R079 Chest pain, unspecified: Secondary | ICD-10-CM | POA: Insufficient documentation

## 2014-05-21 DIAGNOSIS — R479 Unspecified speech disturbances: Secondary | ICD-10-CM | POA: Diagnosis not present

## 2014-05-21 DIAGNOSIS — Z872 Personal history of diseases of the skin and subcutaneous tissue: Secondary | ICD-10-CM | POA: Diagnosis not present

## 2014-05-21 DIAGNOSIS — Z792 Long term (current) use of antibiotics: Secondary | ICD-10-CM | POA: Insufficient documentation

## 2014-05-21 DIAGNOSIS — R404 Transient alteration of awareness: Secondary | ICD-10-CM | POA: Diagnosis not present

## 2014-05-21 DIAGNOSIS — R200 Anesthesia of skin: Secondary | ICD-10-CM | POA: Insufficient documentation

## 2014-05-21 DIAGNOSIS — R0602 Shortness of breath: Secondary | ICD-10-CM | POA: Diagnosis not present

## 2014-05-21 DIAGNOSIS — R531 Weakness: Secondary | ICD-10-CM | POA: Insufficient documentation

## 2014-05-21 DIAGNOSIS — I10 Essential (primary) hypertension: Secondary | ICD-10-CM | POA: Diagnosis not present

## 2014-05-21 DIAGNOSIS — R0789 Other chest pain: Secondary | ICD-10-CM | POA: Diagnosis not present

## 2014-05-21 LAB — CBC WITH DIFFERENTIAL/PLATELET
BASOS PCT: 1 % (ref 0–1)
Basophils Absolute: 0 10*3/uL (ref 0.0–0.1)
Eosinophils Absolute: 0.1 10*3/uL (ref 0.0–0.7)
Eosinophils Relative: 2 % (ref 0–5)
HCT: 40.6 % (ref 36.0–46.0)
Hemoglobin: 13.9 g/dL (ref 12.0–15.0)
Lymphocytes Relative: 33 % (ref 12–46)
Lymphs Abs: 1.9 10*3/uL (ref 0.7–4.0)
MCH: 29.9 pg (ref 26.0–34.0)
MCHC: 34.2 g/dL (ref 30.0–36.0)
MCV: 87.3 fL (ref 78.0–100.0)
Monocytes Absolute: 0.5 10*3/uL (ref 0.1–1.0)
Monocytes Relative: 8 % (ref 3–12)
NEUTROS PCT: 56 % (ref 43–77)
Neutro Abs: 3.4 10*3/uL (ref 1.7–7.7)
Platelets: 300 10*3/uL (ref 150–400)
RBC: 4.65 MIL/uL (ref 3.87–5.11)
RDW: 13.4 % (ref 11.5–15.5)
WBC: 5.9 10*3/uL (ref 4.0–10.5)

## 2014-05-21 LAB — PRO B NATRIURETIC PEPTIDE: PRO B NATRI PEPTIDE: 20.1 pg/mL (ref 0–125)

## 2014-05-21 LAB — I-STAT CHEM 8, ED
BUN: 17 mg/dL (ref 6–23)
CALCIUM ION: 1.12 mmol/L (ref 1.12–1.23)
CHLORIDE: 102 meq/L (ref 96–112)
Creatinine, Ser: 1.2 mg/dL — ABNORMAL HIGH (ref 0.50–1.10)
GLUCOSE: 104 mg/dL — AB (ref 70–99)
HCT: 43 % (ref 36.0–46.0)
Hemoglobin: 14.6 g/dL (ref 12.0–15.0)
Potassium: 3 mEq/L — ABNORMAL LOW (ref 3.7–5.3)
Sodium: 140 mEq/L (ref 137–147)
TCO2: 26 mmol/L (ref 0–100)

## 2014-05-21 LAB — URINALYSIS, ROUTINE W REFLEX MICROSCOPIC
Bilirubin Urine: NEGATIVE
Glucose, UA: NEGATIVE mg/dL
Hgb urine dipstick: NEGATIVE
KETONES UR: NEGATIVE mg/dL
LEUKOCYTES UA: NEGATIVE
Nitrite: NEGATIVE
PROTEIN: NEGATIVE mg/dL
Specific Gravity, Urine: 1.006 (ref 1.005–1.030)
Urobilinogen, UA: 0.2 mg/dL (ref 0.0–1.0)
pH: 8 (ref 5.0–8.0)

## 2014-05-21 LAB — APTT: aPTT: 26 seconds (ref 24–37)

## 2014-05-21 LAB — COMPREHENSIVE METABOLIC PANEL
ALK PHOS: 129 U/L — AB (ref 39–117)
ALT: 32 U/L (ref 0–35)
ANION GAP: 16 — AB (ref 5–15)
AST: 32 U/L (ref 0–37)
Albumin: 3.3 g/dL — ABNORMAL LOW (ref 3.5–5.2)
BILIRUBIN TOTAL: 1.2 mg/dL (ref 0.3–1.2)
BUN: 17 mg/dL (ref 6–23)
CHLORIDE: 98 meq/L (ref 96–112)
CO2: 24 mEq/L (ref 19–32)
Calcium: 9.8 mg/dL (ref 8.4–10.5)
Creatinine, Ser: 1.06 mg/dL (ref 0.50–1.10)
GFR calc non Af Amer: 58 mL/min — ABNORMAL LOW (ref >90)
GFR, EST AFRICAN AMERICAN: 67 mL/min — AB (ref >90)
GLUCOSE: 115 mg/dL — AB (ref 70–99)
Potassium: 4.4 mEq/L (ref 3.7–5.3)
Sodium: 138 mEq/L (ref 137–147)
TOTAL PROTEIN: 8 g/dL (ref 6.0–8.3)

## 2014-05-21 LAB — RAPID URINE DRUG SCREEN, HOSP PERFORMED
Amphetamines: NOT DETECTED
BARBITURATES: NOT DETECTED
BENZODIAZEPINES: NOT DETECTED
COCAINE: NOT DETECTED
Opiates: NOT DETECTED
Tetrahydrocannabinol: NOT DETECTED

## 2014-05-21 LAB — I-STAT TROPONIN, ED: Troponin i, poc: 0 ng/mL (ref 0.00–0.08)

## 2014-05-21 LAB — PROTIME-INR
INR: 1.06 (ref 0.00–1.49)
Prothrombin Time: 13.9 seconds (ref 11.6–15.2)

## 2014-05-21 LAB — ETHANOL: Alcohol, Ethyl (B): <11 mg/dL (ref 0–11)

## 2014-05-21 LAB — TROPONIN I

## 2014-05-21 MED ORDER — ALPRAZOLAM 0.25 MG PO TABS: 0.2500 mg | ORAL_TABLET | Freq: Every evening | ORAL | Status: DC | PRN

## 2014-05-21 MED ORDER — ALPRAZOLAM 0.25 MG PO TABS: 0.2500 mg | ORAL_TABLET | Freq: Every evening | ORAL | Status: AC | PRN

## 2014-05-21 NOTE — ED Provider Notes (Signed)
CSN: [id number]     Arrival date & time 05/21/14  0913 History   First MD Initiated Contact with Patient 05/21/14 [phone]     Chief Complaint  Patient presents with  . Weakness     (Consider location/radiation/quality/duration/timing/severity/associated sxs/prior Treatment) HPI Comments: The patient is a 58 year old female, she has a history of hypertension, she takes hydrochlorothiazide. She was brought in by paramedics because of increasing weakness starting 4 days ago. She also has chest tightness and feels that she is having some shortness of breath as well. She denies any coughing fevers chills nausea vomiting blood in the stools dysuria swelling of the legs. She is having significant difficulty ambulating secondary to her generalized weakness. Nothing seems to make this better or worse, not similar to any symptoms in the past.  Patient is a 58 y.o. female presenting with weakness. The history is provided by the patient and the EMS personnel.  Weakness    Past Medical History  Diagnosis Date  . Hypertension   . Headache(784.0)   . Arthritis     RA BACK   . Cellulitis 06/12/2013    LEFT LEG   Past Surgical History  Procedure Laterality Date  . Back surgery    . Carpal tunnel release Right    No family history on file. History  Substance Use Topics  . Smoking status: Never Smoker   . Smokeless tobacco: Never Used  . Alcohol Use: No   OB History    No data available     Review of Systems  Neurological: Positive for weakness.  All other systems reviewed and are negative.     Allergies  Codeine  Home Medications   Prior to Admission medications   Medication Sig Start Date End Date Taking? Authorizing Provider  COLCRYS 0.6 MG tablet Take 0.6 mg by mouth daily as needed (Gout).  05/07/14  Yes Historical Provider, MD  hydrochlorothiazide (HYDRODIURIL) 25 MG tablet Take 25 mg by mouth every morning.  04/27/14  Yes Historical Provider, MD  simvastatin (ZOCOR) 40 MG  tablet Take 40 mg by mouth every evening.  04/28/14  Yes Historical Provider, MD  tolterodine (DETROL LA) 2 MG 24 hr capsule Take 2 mg by mouth at bedtime.  05/08/14  Yes Historical Provider, MD  ALPRAZolam Prudy Feeler(XANAX) 0.25 MG tablet Take 1 tablet (0.25 mg total) by mouth at bedtime as needed for anxiety. 05/21/14   Vida RollerBrian D Elisabel Hanover, MD  clindamycin (CLEOCIN) 300 MG capsule Take 1 capsule (300 mg total) by mouth 3 (three) times daily. Patient not taking: Reported on 05/21/2014 06/16/13   Katy Apoonald D Polite, MD  fluticasone [facility](FLONASE) 50 MCG/ACT nasal spray Place 2 sprays into the nose daily. 2 sprays each nostril daily for allergies/congestion Patient not taking: Reported on 05/21/2014 03/10/11   Jacquelyn A McGill, MD  meloxicam (MOBIC) 7.5 MG tablet Take 7.5 mg by mouth daily as needed for pain. For arthritis    Historical Provider, MD   BP 126/98 mmHg  Pulse 69  Temp(Src) 98.9 F (37.2 C) (Oral)  Resp 16  Ht 5' (1.524 m)  Wt 211 lb (95.709 kg)  BMI 41.21 kg/m2  SpO2 99% Physical Exam  Constitutional: She appears well-developed and well-nourished. No distress.  HENT:  Head: Normocephalic and atraumatic.  Mouth/Throat: Oropharynx is clear and moist. No oropharyngeal exudate.  Eyes: Conjunctivae and EOM are normal. Pupils are equal, round, and reactive to light. Right eye exhibits no discharge. Left eye exhibits no discharge. No scleral icterus.  Neck:  Normal range of motion. Neck supple. No JVD present. No thyromegaly present.  Cardiovascular: Normal rate, regular rhythm, normal heart sounds and intact distal pulses.  Exam reveals no gallop and no friction rub.   No murmur heard. Pulmonary/Chest: Effort normal and breath sounds normal. No respiratory distress. She has no wheezes. She has no rales.  Abdominal: Soft. Bowel sounds are normal. She exhibits no distension and no mass. There is no tenderness.  Musculoskeletal: Normal range of motion. She exhibits no edema or tenderness.  Lymphadenopathy:     She has no cervical adenopathy.  Neurological: She is alert. Coordination normal.  Bilateral upper extremity tremor, no pronator drift, normal strength in all 4 extremities against resistance, cranial nerves III through XII intact, no facial droop, normal sensation in all 4 extremities face and trunk. Speech is clear but has a slight stutter which she states is abnormal  Skin: Skin is warm and dry. No rash noted. No erythema.  Psychiatric: She has a normal mood and affect. Her behavior is normal.  Nursing note and vitals reviewed.   ED Course  Procedures (including critical care time) Labs Review Labs Reviewed  COMPREHENSIVE METABOLIC PANEL - Abnormal; Notable for the following:    Glucose, Bld 115 (*)    Albumin 3.3 (*)    Alkaline Phosphatase 129 (*)    GFR calc non Af Amer 58 (*)    GFR calc Af Amer 67 (*)    Anion gap 16 (*)    All other components within normal limits  I-STAT CHEM 8, ED - Abnormal; Notable for the following:    Potassium 3.0 (*)    Creatinine, Ser 1.20 (*)    Glucose, Bld 104 (*)    All other components within normal limits  CBC WITH DIFFERENTIAL  TROPONIN I  PRO B NATRIURETIC PEPTIDE  URINALYSIS, ROUTINE W REFLEX MICROSCOPIC  ETHANOL  PROTIME-INR  APTT  URINE RAPID DRUG SCREEN (HOSP PERFORMED)  I-STAT TROPOININ, ED  I-STAT TROPOININ, ED    Imaging Review Ct Head Wo Contrast  05/21/2014   CLINICAL DATA:  Weakness  EXAM: CT HEAD WITHOUT CONTRAST  TECHNIQUE: Contiguous axial images were obtained from the base of the skull through the vertex without intravenous contrast.  COMPARISON:  12/04/2003  FINDINGS: Ventricle size is normal. Mild chronic microvascular ischemic change in the periventricular white matter similar to the prior study  Negative for acute infarct.  Negative for hemorrhage or mass lesion.  Calvarium intact.  Mild mucosal edema in the maxillary sinus.  IMPRESSION: Mild chronic microvascular ischemic change in the white matter. No acute  abnormality.   Electronically Signed   By: Marlan Palau M.D.   On: 05/21/2014 14:01   Dg Chest Port 1 View  05/21/2014   CLINICAL DATA:  Chest pain, shortness of breath  EXAM: PORTABLE CHEST - 1 VIEW  COMPARISON:  06/14/2009  FINDINGS: Lungs are clear.  No pleural effusion or pneumothorax.  The heart is top-normal in size.  IMPRESSION: No evidence of acute cardiopulmonary disease.   Electronically Signed   By: Charline Bills M.D.   On: 05/21/2014 09:55     EKG Interpretation   Date/Time:  Monday May 21 2014 09:22:54 EST Ventricular Rate:  93 PR Interval:  149 QRS Duration: 81 QT Interval:  386 QTC Calculation: 480 R Axis:   24 Text Interpretation:  Sinus rhythm Multiform ventricular premature  complexes Low voltage, precordial leads ST elevation, consider inferior  injury Baseline wander in lead(s) III since last  tracing no significant  change Confirmed by Trevia Nop  MD, Deandrea Vanpelt ([id number]) on 05/21/2014 9:28:24 AM      MDM   Final diagnoses:  Chest pain  Weakness  Stuttering    The patient has an abnormal exam, she appears generally weak, she is a tremor and a stutter, her EKG shows low-voltage QRS with occasional ectopic beats, consider metabolic abnormality such as hypokalemia, urinary infection, physical exam is otherwise unremarkable, vital signs otherwise unremarkable, less likely to be stroke given no lateralizing symptoms, no headache.  CT scan negative, labs unremarkable, discussed with Dr. Thad Rangereynolds who has seen the patient from neurology, she agrees that this is not related to a stroke and more likely related to stress and anxiety, the patient still has a mild stutter but otherwise a repeat neurologic exam is completely unremarkable including no tremor, no weakness. The patient has been informed of these results, she is amenable to discharge to follow-up with her family doctor this week, she has an appointment on Thursday. I have prescribed Xanax to be used as needed for  anxiety, I have gone over the precautions with the family, they are in agreement.  Meds given in ED:  Medications - No data to display  New Prescriptions   ALPRAZOLAM (XANAX) 0.25 MG TABLET    Take 1 tablet (0.25 mg total) by mouth at bedtime as needed for anxiety.      Vida RollerBrian D Aldan Camey, MD 05/21/14 [phone]

## 2014-05-21 NOTE — ED Notes (Signed)
Pt provided Malaw[place]key sandwich and sprite at time of discharge. Discharged home with daughter and husband.

## 2014-05-21 NOTE — Discharge Instructions (Signed)
Please call your doctor for a followup appointment within 24-48 hours. When you talk to your doctor please let them know that you were seen in the emergency department and have them acquire all of your records so that they can discuss the findings with you and formulate a treatment plan to fully care for your new and ongoing problems. ° °

## 2014-05-21 NOTE — ED Notes (Signed)
Per [facility]: pt. Presents with complaint of increased weakness and chest tightness starting 12/10. Pt. States that pain radiated to L arm once. Denies cardiac hx. Pt. States is SOB. Pt. Having some stuttering. Pt. Had nausea x1.

## 2014-05-21 NOTE — Consult Note (Addendum)
Referring Physician: Hyacinth MeekerMiller    Chief Complaint: Stuttering speech  HPI: Helen Brown is an 58 y.o. female who reports that she has been feeling poorly for a few days.  Since the 10th her blood pressure has been elevated and she has been experiencing generalized weakness.  She awakened today and her speech was normal.  By 0800 she noted that she was stuttering.  She has continued to stutter.   She also reports LLE weakness that has been present for the past 2-3 months at the least.  She also reports RUE and right facial numbness that has been present for at least that period of time as well.    Date last known well: Unable to determine Time last known well: Unable to determine tPA Given: No: Outside time window  Past Medical History  Diagnosis Date  . Hypertension   . Headache(784.0)   . Arthritis     RA BACK   . Cellulitis 06/12/2013    LEFT LEG    Past Surgical History  Procedure Laterality Date  . Back surgery    . Carpal tunnel release Right     Family history:  Both parents with hypertension.  Father with CAD as well.    Social History:  reports that she has never smoked. She has never used smokeless tobacco. She reports that she does not drink alcohol or use illicit drugs.  Allergies:  Allergies  Allergen Reactions  . Codeine Rash    Medications: I have reviewed the patient's current medications. Prior to Admission:  Current outpatient prescriptions:  COLCRYS 0.6 MG tablet, Take 0.6 mg by mouth daily as needed (Gout). , Disp: , Rfl: 2;   hydrochlorothiazide (HYDRODIURIL) 25 MG tablet, Take 25 mg by mouth every morning. , Disp: , Rfl: 11;   simvastatin (ZOCOR) 40 MG tablet, Take 40 mg by mouth every evening. , Disp: , Rfl: 11;   tolterodine (DETROL LA) 2 MG 24 hr capsule, Take 2 mg by mouth at bedtime. , Disp: , Rfl: 11 ALPRAZolam (XANAX) 0.25 MG tablet, Take 1 tablet (0.25 mg total) by mouth at bedtime as needed for anxiety., Disp: 15 tablet, Rfl: 0;  clindamycin  (CLEOCIN) 300 MG capsule, Take 1 capsule (300 mg total) by mouth 3 (three) times daily. (Patient not taking: Reported on 05/21/2014), Disp: 30 capsule, Rfl: 0 fluticasone (FLONASE) 50 MCG/ACT nasal spray, Place 2 sprays into the nose daily. 2 sprays each nostril daily for allergies/congestion (Patient not taking: Reported on 05/21/2014), Disp: 16 g, Rfl: 0;   meloxicam (MOBIC) 7.5 MG tablet, Take 7.5 mg by mouth daily as needed for pain. For arthritis, Disp: , Rfl:   ROS: History obtained from the patient  General ROS: fatigue Psychological ROS: increased stressors Ophthalmic ROS: negative for - blurry vision, double vision, eye pain or loss of vision ENT ROS: negative for - epistaxis, nasal discharge, oral lesions, sore throat, tinnitus or vertigo Allergy and Immunology ROS: negative for - hives or itchy/watery eyes Hematological and Lymphatic ROS: negative for - bleeding problems, bruising or swollen lymph nodes Endocrine ROS: negative for - galactorrhea, hair pattern changes, polydipsia/polyuria or temperature intolerance Respiratory ROS: negative for - cough, hemoptysis, shortness of breath or wheezing Cardiovascular ROS: lower extremity edema Gastrointestinal ROS: negative for - abdominal pain, diarrhea, hematemesis, nausea/vomiting or stool incontinence Genito-Urinary ROS: negative for - dysuria, hematuria, incontinence or urinary frequency/urgency Musculoskeletal ROS: back pain Neurological ROS: as noted in HPI Dermatological ROS: negative for rash and skin lesion changes  Physical Examination: Blood pressure 126/98, pulse 69, temperature 98.9 F (37.2 C), temperature source Oral, resp. rate 16, height 5' (1.524 m), weight 95.709 kg (211 lb), SpO2 99 %.  HEENT-  Normocephalic, no lesions, without obvious abnormality.  Normal external eye and conjunctiva.  Normal TM's bilaterally.  Normal auditory canals and external ears. Normal external nose, mucus membranes and septum.  Normal  pharynx. Cardiovascular- S1, S2 normal, pulses palpable throughout   Lungs- chest clear, no wheezing, rales, normal symmetric air entry Abdomen- soft, non-tender; bowel sounds normal; no masses,  no organomegaly Extremities- pitting edema in both lower extremities Lymph-no adenopathy palpable Musculoskeletal-no joint tenderness, deformity or swelling Skin-warm and dry, no hyperpigmentation, vitiligo, or suspicious lesions  Neurological Examination Mental Status: Alert, oriented, thought content appropriate.  Speech fluent without evidence of aphasia.  Stuttering present.  Able to follow 3 step commands without difficulty. Cranial Nerves: II: Discs flat bilaterally; Visual fields grossly normal, pupils equal, round, reactive to light and accommodation III,IV, VI: ptosis not present, extra-ocular motions intact bilaterally V,VII: smile symmetric, facial light touch sensation decreased on the right side of the face VIII: hearing normal bilaterally IX,X: gag reflex present XI: bilateral shoulder shrug XII: midline tongue extension Motor: Right : Upper extremity   5/5    Left:     Upper extremity   5/5  Lower extremity   5/5     Lower extremity   5/5 Tone and bulk:normal tone throughout; no atrophy noted Sensory: Pinprick and light touch decreased in the right upper extremity Deep Tendon Reflexes: 2+ and symmetric with absent AJ's biilaterally Plantars: Right: downgoing   Left: downgoing Cerebellar: normal finger-to-nose and normal heel-to-shin testing bilaterally   Laboratory Studies:  Basic Metabolic Panel:  Recent Labs Lab 05/21/14 0941 05/21/14 1046  NA 138 140  K 4.4 3.0*  CL 98 102  CO2 24  --   GLUCOSE 115* 104*  BUN 17 17  CREATININE 1.06 1.20*  CALCIUM 9.8  --     Liver Function Tests:  Recent Labs Lab 05/21/14 0941  AST 32  ALT 32  ALKPHOS 129*  BILITOT 1.2  PROT 8.0  ALBUMIN 3.3*   No results for input(s): LIPASE, AMYLASE in the last 168 hours. No  results for input(s): AMMONIA in the last 168 hours.  CBC:  Recent Labs Lab 05/21/14 0941 05/21/14 1046  WBC 5.9  --   NEUTROABS 3.4  --   HGB 13.9 14.6  HCT 40.6 43.0  MCV 87.3  --   PLT 300  --     Cardiac Enzymes:  Recent Labs Lab 05/21/14 0941  TROPONINI <0.30    BNP: Invalid input(s): POCBNP  CBG: No results for input(s): GLUCAP in the last 168 hours.  Microbiology: Results for orders placed or performed during the hospital encounter of 06/12/13  Wound culture     Status: None   Collection Time: 06/13/13  1:35 AM  Result Value Ref Range Status   Specimen Description WOUND LEFT LEG  Final   Special Requests NONE  Final   Gram Stain   Final    NO WBC SEEN NO SQUAMOUS EPITHELIAL CELLS SEEN NO ORGANISMS SEEN Performed at [facility]   Culture   Final    MODERATE METHICILLIN RESISTANT STAPHYLOCOCCUS AUREUS Note: RIFAMPIN AND GENTAMICIN SHOULD NOT BE USED AS SINGLE DRUGS FOR TREATMENT OF STAPH INFECTIONS. This organism DOES NOT demonstrate inducible Clindamycin resistance in vitro. CRITICAL RESULT CALLED TO, READ BACK BY AND VERIFIED WITH: S  LEE 06/15/13 AT  815 AM BY [facility] Performed at [facility]   Report Status 06/15/2013 FINAL  Final   Organism ID, Bacteria METHICILLIN RESISTANT STAPHYLOCOCCUS AUREUS  Final      Susceptibility   Methicillin resistant staphylococcus aureus - MIC*    CLINDAMYCIN <=0.25 SENSITIVE Sensitive     ERYTHROMYCIN >=8 RESISTANT Resistant     GENTAMICIN <=0.5 SENSITIVE Sensitive     LEVOFLOXACIN 4 INTERMEDIATE Intermediate     OXACILLIN >=4 RESISTANT Resistant     PENICILLIN >=0.5 RESISTANT Resistant     RIFAMPIN <=0.5 SENSITIVE Sensitive     TRIMETH/SULFA <=10 SENSITIVE Sensitive     VANCOMYCIN 1 SENSITIVE Sensitive     TETRACYCLINE <=1 SENSITIVE Sensitive     * MODERATE METHICILLIN RESISTANT STAPHYLOCOCCUS AUREUS    Coagulation Studies:  Recent Labs  05/21/14 1036  LABPROT 13.9  INR 1.06     Urinalysis:  Recent Labs Lab 05/21/14 0954  COLORURINE YELLOW  LABSPEC 1.006  PHURINE 8.0  GLUCOSEU NEGATIVE  HGBUR NEGATIVE  BILIRUBINUR NEGATIVE  KETONESUR NEGATIVE  PROTEINUR NEGATIVE  UROBILINOGEN 0.2  NITRITE NEGATIVE  LEUKOCYTESUR NEGATIVE    Lipid Panel:    Component Value Date/Time   CHOL 223* 09/10/2009 2122   TRIG 67 09/10/2009 2122   HDL 45 09/10/2009 2122   CHOLHDL 5.0 Ratio 09/10/2009 2122   VLDL 13 09/10/2009 2122   LDLCALC 165* 09/10/2009 2122    HgbA1C: No results found for: HGBA1C  Urine Drug Screen:     Component Value Date/Time   LABOPIA NONE DETECTED 05/21/2014 0954   COCAINSCRNUR NONE DETECTED 05/21/2014 0954   LABBENZ NONE DETECTED 05/21/2014 0954   AMPHETMU NONE DETECTED 05/21/2014 0954   THCU NONE DETECTED 05/21/2014 0954   LABBARB NONE DETECTED 05/21/2014 0954    Alcohol Level:  Recent Labs Lab 05/21/14 1036  ETH <11    Other results: EKG: normal sinus rhythm at 93 bpm  Imaging: Ct Head Wo Contrast  05/21/2014   CLINICAL DATA:  Weakness  EXAM: CT HEAD WITHOUT CONTRAST  TECHNIQUE: Contiguous axial images were obtained from the base of the skull through the vertex without intravenous contrast.  COMPARISON:  12/04/2003  FINDINGS: Ventricle size is normal. Mild chronic microvascular ischemic change in the periventricular white matter similar to the prior study  Negative for acute infarct.  Negative for hemorrhage or mass lesion.  Calvarium intact.  Mild mucosal edema in the maxillary sinus.  IMPRESSION: Mild chronic microvascular ischemic change in the white matter. No acute abnormality.   Electronically Signed   By: Marlan Palau M.D.   On: 05/21/2014 14:01   Dg Chest Port 1 View  05/21/2014   CLINICAL DATA:  Chest pain, shortness of breath  EXAM: PORTABLE CHEST - 1 VIEW  COMPARISON:  06/14/2009  FINDINGS: Lungs are clear.  No pleural effusion or pneumothorax.  The heart is top-normal in size.  IMPRESSION: No evidence of acute  cardiopulmonary disease.   Electronically Signed   By: Charline Bills M.D.   On: 05/21/2014 09:55    Assessment: 58 y.o. female presenting with complaints of generalized weakness and stuttering speech.  On neurological examination there is no evidence of objective focal findings.  No aphasia noted.  Head CT reviewed personally and shows no acute changes but does show evidence of extensive microvascular disease.  On a statin.  Do not suspect an acute ischemic event or other neurological etiology.    Stroke Risk Factors - hypertension  Plan: 1. No  further stroke work up recommended 2. ECASA 325mg  daily 3. BP control  Case discussed with Dr. Sherle PoeMiller  Trudee Chirino, MD [facility] [phone] 05/21/2014, 3:33 PM

## 2014-05-22 DIAGNOSIS — F329 Major depressive disorder, single episode, unspecified: Secondary | ICD-10-CM | POA: Diagnosis not present

## 2014-05-22 DIAGNOSIS — F8081 Childhood onset fluency disorder: Secondary | ICD-10-CM | POA: Diagnosis not present

## 2014-06-06 ENCOUNTER — Emergency Department (HOSPITAL_COMMUNITY): Payer: Medicare Other

## 2014-06-06 ENCOUNTER — Encounter (HOSPITAL_COMMUNITY): Payer: Self-pay | Admitting: *Deleted

## 2014-06-06 ENCOUNTER — Emergency Department (HOSPITAL_COMMUNITY)
Admission: EM | Admit: 2014-06-06 | Discharge: 2014-06-06 | Disposition: A | Discharge status: Home / Self Care | Payer: Medicare Other | Attending: Emergency Medicine | Admitting: Emergency Medicine

## 2014-06-06 DIAGNOSIS — I10 Essential (primary) hypertension: Secondary | ICD-10-CM | POA: Insufficient documentation

## 2014-06-06 DIAGNOSIS — R531 Weakness: Secondary | ICD-10-CM | POA: Diagnosis not present

## 2014-06-06 DIAGNOSIS — J9811 Atelectasis: Secondary | ICD-10-CM | POA: Diagnosis not present

## 2014-06-06 DIAGNOSIS — Z7951 Long term (current) use of inhaled steroids: Secondary | ICD-10-CM | POA: Insufficient documentation

## 2014-06-06 DIAGNOSIS — Z8639 Personal history of other endocrine, nutritional and metabolic disease: Secondary | ICD-10-CM | POA: Diagnosis not present

## 2014-06-06 DIAGNOSIS — F8081 Childhood onset fluency disorder: Secondary | ICD-10-CM | POA: Insufficient documentation

## 2014-06-06 DIAGNOSIS — R4701 Aphasia: Secondary | ICD-10-CM | POA: Insufficient documentation

## 2014-06-06 DIAGNOSIS — R200 Anesthesia of skin: Secondary | ICD-10-CM | POA: Diagnosis not present

## 2014-06-06 DIAGNOSIS — Z79899 Other long term (current) drug therapy: Secondary | ICD-10-CM | POA: Diagnosis not present

## 2014-06-06 DIAGNOSIS — Z872 Personal history of diseases of the skin and subcutaneous tissue: Secondary | ICD-10-CM | POA: Diagnosis not present

## 2014-06-06 DIAGNOSIS — R079 Chest pain, unspecified: Secondary | ICD-10-CM | POA: Diagnosis not present

## 2014-06-06 LAB — BASIC METABOLIC PANEL
Anion gap: 6 (ref 5–15)
BUN: 14 mg/dL (ref 6–23)
CALCIUM: 9.2 mg/dL (ref 8.4–10.5)
CO2: 32 mmol/L (ref 19–32)
Chloride: 103 mEq/L (ref 96–112)
Creatinine, Ser: 1.03 mg/dL (ref 0.50–1.10)
GFR calc Af Amer: 69 mL/min — ABNORMAL LOW (ref >90)
GFR calc non Af Amer: 60 mL/min — ABNORMAL LOW (ref >90)
GLUCOSE: 94 mg/dL (ref 70–99)
Potassium: 2.9 mmol/L — ABNORMAL LOW (ref 3.5–5.1)
Sodium: 141 mmol/L (ref 135–145)

## 2014-06-06 LAB — URINALYSIS, ROUTINE W REFLEX MICROSCOPIC
Bilirubin Urine: NEGATIVE
Glucose, UA: NEGATIVE mg/dL
HGB URINE DIPSTICK: NEGATIVE
Ketones, ur: NEGATIVE mg/dL
Leukocytes, UA: NEGATIVE
Nitrite: NEGATIVE
Protein, ur: NEGATIVE mg/dL
SPECIFIC GRAVITY, URINE: 1.018 (ref 1.005–1.030)
UROBILINOGEN UA: 0.2 mg/dL (ref 0.0–1.0)
pH: 7 (ref 5.0–8.0)

## 2014-06-06 LAB — CBC WITH DIFFERENTIAL/PLATELET
Basophils Absolute: 0 10*3/uL (ref 0.0–0.1)
Basophils Relative: 1 % (ref 0–1)
EOS PCT: 4 % (ref 0–5)
Eosinophils Absolute: 0.2 10*3/uL (ref 0.0–0.7)
HEMATOCRIT: 36.9 % (ref 36.0–46.0)
HEMOGLOBIN: 12.2 g/dL (ref 12.0–15.0)
LYMPHS ABS: 1.5 10*3/uL (ref 0.7–4.0)
LYMPHS PCT: 28 % (ref 12–46)
MCH: 29.6 pg (ref 26.0–34.0)
MCHC: 33.1 g/dL (ref 30.0–36.0)
MCV: 89.6 fL (ref 78.0–100.0)
MONO ABS: 0.4 10*3/uL (ref 0.1–1.0)
MONOS PCT: 8 % (ref 3–12)
Neutro Abs: 3.2 10*3/uL (ref 1.7–7.7)
Neutrophils Relative %: 59 % (ref 43–77)
PLATELETS: 243 10*3/uL (ref 150–400)
RBC: 4.12 MIL/uL (ref 3.87–5.11)
RDW: 13.4 % (ref 11.5–15.5)
WBC: 5.3 10*3/uL (ref 4.0–10.5)

## 2014-06-06 LAB — I-STAT TROPONIN, ED
TROPONIN I, POC: 0 ng/mL (ref 0.00–0.08)
TROPONIN I, POC: 0 ng/mL (ref 0.00–0.08)

## 2014-06-06 MED ORDER — POTASSIUM CHLORIDE CRYS ER 20 MEQ PO TBCR
40.0000 meq | EXTENDED_RELEASE_TABLET | Freq: Once | ORAL | Status: AC
  Administered 2014-06-06: 40 meq via ORAL
  Filled 2014-06-06: qty 2

## 2014-06-06 MED ORDER — LORAZEPAM 1 MG PO TABS
1.0000 mg | ORAL_TABLET | Freq: Once | ORAL | Status: AC
  Administered 2014-06-06: 1 mg via ORAL
  Filled 2014-06-06: qty 1

## 2014-06-06 NOTE — ED Notes (Signed)
Patient transported to MRI 

## 2014-06-06 NOTE — ED Provider Notes (Signed)
CSN: [id number]     Arrival date & time 06/06/14  [id number] History   First Helen Brown Initiated Contact with Patient 06/06/14 1002     Chief Complaint  Patient presents with  . Chest Pain  . Aphasia     (Consider location/radiation/quality/duration/timing/severity/associated sxs/prior Treatment) HPI    PCP: Helen Brown,Helen Brown, Helen Brown  [name] is a 58 y.o.female with a significant PMH of hypertension, arthritis, arthritis, cellulitis presents to the ER with complaints of stuttering, generalized weakness and chest pains. She was seen earlier this month with a similar presentation and was sent home after determining her symptoms are related to anxiety. Per the husband she improves and is no longer stuttering.  This morning, while at rest at approx 10 am her symptoms reoccurred. When her husband called home to check on her from his work he noted that she was crying and stuttering again. She complained of generalized weakness and chest pain that radiated down her left arm. She was due for a PCP appointment this afternoon but he brought her here instead. Currently the patient is not stuttering. She continues to endorse vague chest pain and generalized weakness.  Blood pressure 112/45, pulse 85, temperature 98.2 F (36.8 C), temperature source Oral, resp. rate 20, SpO2 100 %.   Past Medical History  Diagnosis Date  . Hypertension   . Headache(784.0)   . Arthritis     RA BACK   . Cellulitis 06/12/2013    LEFT LEG   Past Surgical History  Procedure Laterality Date  . Back surgery    . Carpal tunnel release Right    History reviewed. No pertinent family history. History  Substance Use Topics  . Smoking status: Never Smoker   . Smokeless tobacco: Never Used  . Alcohol Use: No   OB History    No data available     Review of Systems  10 Systems reviewed and are negative for acute change except as noted in the HPI.   Allergies  Codeine  Home Medications   Prior to Admission  medications   Medication Sig Start Date End Date Taking? Authorizing Provider  ALPRAZolam (XANAX) 0.25 MG tablet Take 1 tablet (0.25 mg total) by mouth at bedtime as needed for anxiety. 05/21/14  Yes Helen Brown, Helen Brown  citalopram (CELEXA) 10 MG tablet Take 10 mg by mouth daily.   Yes Historical Provider, Helen Brown  hydrochlorothiazide (HYDRODIURIL) 25 MG tablet Take 25 mg by mouth every morning.  04/27/14  Yes Historical Provider, Helen Brown  simvastatin (ZOCOR) 40 MG tablet Take 40 mg by mouth every evening.  04/28/14  Yes Historical Provider, Helen Brown  tolterodine (DETROL LA) 2 MG 24 hr capsule Take 2 mg by mouth at bedtime.  05/08/14  Yes Historical Provider, Helen Brown  clindamycin (CLEOCIN) 300 MG capsule Take 1 capsule (300 mg total) by mouth 3 (three) times daily. Patient not taking: Reported on 05/21/2014 06/16/13   Helen Brown, Helen Brown  COLCRYS 0.6 MG tablet Take 0.6 mg by mouth daily as needed (Gout).  05/07/14   Historical Provider, Helen Brown  fluticasone (FLONASE) 50 MCG/ACT nasal spray Place 2 sprays into the nose daily. 2 sprays each nostril daily for allergies/congestion Patient not taking: Reported on 05/21/2014 03/10/11   Helen Brown, Helen Brown   BP 118/78 mmHg  Pulse 61  Temp(Src) 98.2 F (36.8 C) (Oral)  Resp 17  SpO2 98% Physical Exam  Constitutional: She appears well-developed and well-nourished. No distress.  HENT:  Head: Normocephalic and atraumatic.  Eyes: Pupils  are equal, round, and reactive to light.  Neck: Normal range of motion. Neck supple.  Cardiovascular: Normal rate and regular rhythm.   Pulmonary/Chest: Effort normal. No respiratory distress. She has no wheezes.  Abdominal: Soft. She exhibits no distension. There is no tenderness. There is no rebound.  Musculoskeletal:  Generalized tenderness to all 4 extremities. Mild swelling to bilateral lower extremities. Pt exhibits generalized weakness to all 4 extremities with questionable effort.  Neurological: She is alert.  Cranial nerves II-VIII  and X-XII evaluated and show no deficits. Pt alert and oriented x 3 Upper and lower extremity strength is symmetrical but decreased Normal muscular tone No facial droop No aphagia or stutter. Coordination intact, no limb ataxia  Skin: Skin is warm and dry.  Nursing note and vitals reviewed.   ED Course  Procedures (including critical care time) Labs Review Labs Reviewed  BASIC METABOLIC PANEL - Abnormal; Notable for the following:    Potassium 2.9 (*)    GFR calc non Af Amer 60 (*)    GFR calc Af Amer 69 (*)    All other components within normal limits  CBC WITH DIFFERENTIAL  URINALYSIS, ROUTINE W REFLEX MICROSCOPIC  I-STAT TROPOININ, ED    Imaging Review Dg Chest 2 View  06/06/2014   CLINICAL DATA:  Left-sided chest pain on off for 2 weeks. Left arm pain last night. History of hypertension.  EXAM: CHEST  2 VIEW  COMPARISON:  05/21/2014  FINDINGS: Heart is normal size. Minimal bibasilar atelectasis. No effusions or edema. No acute bony abnormality.  IMPRESSION: Bibasilar atelectasis.   Electronically Signed   By: Charlett Nose M.Brown.   On: 06/06/2014 11:12   Ct Head Wo Contrast  06/06/2014   CLINICAL DATA:  Difficulty speaking since 05/17/2012. Patient was evaluated and told it was related to stress and anxiety. Symptoms are worse. Numb sensation in the arms. Chest pain. Stuttering comes and goes.  EXAM: CT HEAD WITHOUT CONTRAST  TECHNIQUE: Contiguous axial images were obtained from the base of the skull through the vertex without intravenous contrast.  COMPARISON:  05/21/2014  FINDINGS: There is no intra or extra-axial fluid collection or mass lesion. The basilar cisterns and ventricles have a normal appearance. There is no CT evidence for acute infarction or hemorrhage. Bone windows are unremarkable.  IMPRESSION: No evidence for acute intracranial abnormality.   Electronically Signed   By: Rosalie Gums M.Brown.   On: 06/06/2014 12:04     EKG Interpretation   Date/Time:  Wednesday  June 06 2014 09:57:50 EST Ventricular Rate:  89 PR Interval:  154 QRS Duration: 74 QT Interval:  368 QTC Calculation: 447 R Axis:   0 Text Interpretation:  Sinus rhythm with Premature supraventricular  complexes and with occasional Premature ventricular complexes Low voltage  QRS Cannot rule out Anterior infarct , age undetermined Abnormal ECG No  significant change was found Confirmed by Manus Gunning  Helen Brown, STEPHEN [phone]) on  06/06/2014 10:06:35 AM      MDM   Final diagnoses:  Chest pain  Stuttering  Weakness   The patient has been seen by  Glynn Octave, Helen Brown. My supervising physician agrees with the work-up and diagnosis. The EDPhysician feels comfortable with the patients disposition and treatment plan.  The patients symptoms are very atypical and her initial workup of Trop, bmp, cbc, EKG, Chest xray and Head CT are unremarkable. I dicussed case with Dr. Cyril Mourning who agrees and MRI brain would be reasonable.  1:00pm- MRI brain ordered.  1: 51  pm- Patients potassium found to be low. Rx 40 meq of potassium for replacement. She takes HCTZ but otherwise unclear why she chronically has low potassium  3: 00pm-  At end of shift, patient hand off to the oncoming resident Helen Brown, Ave FilterMike Goble - MRI pending  Filed Vitals:   06/06/14 1349  BP: 111/69  Pulse: 68  Temp:   Resp: [address]16     Dara Camargo G Reyansh Kushnir, PA-C 06/06/14 1500  Glynn OctaveStephen Rancour, Helen Brown 06/06/14 1819

## 2014-06-06 NOTE — ED Provider Notes (Signed)
Assumption of Care Note:  I assumed care of this patient at 15:00 from Marlon Peliffany Greene, [place]. Please see her note for more detail. Briefly, this pt is a 58 y.o. F who presents with chest pain, global weakness and aphasia. Has been seen for same in past. Workup including head CT and troponins negative. Patient is ambulatory in ED. MRI brain ordered. At time of signout plan is to follow up MRI and if negative for acute pathology will have patient follow up with PCP.   MRI of brain with no acute findings. Patient discharged home with followup as above as well as standard ED return precautions. Patient expresses understanding and agreement with this plan.  Patient seen with attending, Dr. Juleen ChinaKohut, who oversaw clinical decision making.   Lula OlszewskiMike Illa Enlow, MD 06/06/14 [id number]  Raeford RazorStephen Kohut, MD 06/07/14 [phone]

## 2014-06-06 NOTE — ED Notes (Signed)
Pt reports being seen here recently for same. Reports having difficulty speaking since 12/10, was seen here and told it was related to stress level and anxiety. Reports symptoms having gotten worse and now also has a numbness sensation to her arms and chest pains. No acute distress noted at triage.

## 2014-06-07 DIAGNOSIS — R5383 Other fatigue: Secondary | ICD-10-CM | POA: Diagnosis not present

## 2014-06-07 DIAGNOSIS — R0789 Other chest pain: Secondary | ICD-10-CM | POA: Diagnosis not present

## 2014-06-07 DIAGNOSIS — I771 Stricture of artery: Secondary | ICD-10-CM | POA: Diagnosis not present

## 2014-06-07 DIAGNOSIS — R51 Headache: Secondary | ICD-10-CM | POA: Diagnosis not present

## 2014-06-07 DIAGNOSIS — R079 Chest pain, unspecified: Secondary | ICD-10-CM | POA: Diagnosis not present

## 2014-06-07 DIAGNOSIS — M79651 Pain in right thigh: Secondary | ICD-10-CM | POA: Diagnosis not present

## 2014-06-07 DIAGNOSIS — R531 Weakness: Secondary | ICD-10-CM | POA: Diagnosis not present

## 2014-06-07 DIAGNOSIS — R5381 Other malaise: Secondary | ICD-10-CM | POA: Diagnosis not present

## 2014-06-07 DIAGNOSIS — R9431 Abnormal electrocardiogram [ECG] [EKG]: Secondary | ICD-10-CM | POA: Diagnosis not present

## 2014-06-07 DIAGNOSIS — F8081 Childhood onset fluency disorder: Secondary | ICD-10-CM | POA: Diagnosis not present

## 2014-06-07 DIAGNOSIS — M79652 Pain in left thigh: Secondary | ICD-10-CM | POA: Diagnosis not present

## 2014-06-15 DIAGNOSIS — F329 Major depressive disorder, single episode, unspecified: Secondary | ICD-10-CM | POA: Diagnosis not present

## 2014-06-15 DIAGNOSIS — F419 Anxiety disorder, unspecified: Secondary | ICD-10-CM | POA: Diagnosis not present

## 2014-07-25 DIAGNOSIS — I10 Essential (primary) hypertension: Secondary | ICD-10-CM | POA: Diagnosis not present

## 2014-07-25 DIAGNOSIS — E663 Overweight: Secondary | ICD-10-CM | POA: Diagnosis not present

## 2014-07-25 DIAGNOSIS — Z6841 Body Mass Index (BMI) 40.0 and over, adult: Secondary | ICD-10-CM | POA: Diagnosis not present

## 2014-07-25 DIAGNOSIS — F329 Major depressive disorder, single episode, unspecified: Secondary | ICD-10-CM | POA: Diagnosis not present

## 2014-07-25 DIAGNOSIS — E780 Pure hypercholesterolemia: Secondary | ICD-10-CM | POA: Diagnosis not present

## 2014-09-03 DIAGNOSIS — M25512 Pain in left shoulder: Secondary | ICD-10-CM | POA: Diagnosis not present

## 2014-09-06 DIAGNOSIS — M25512 Pain in left shoulder: Secondary | ICD-10-CM | POA: Diagnosis not present

## 2014-09-06 DIAGNOSIS — M7502 Adhesive capsulitis of left shoulder: Secondary | ICD-10-CM | POA: Diagnosis not present

## 2014-09-06 DIAGNOSIS — M7542 Impingement syndrome of left shoulder: Secondary | ICD-10-CM | POA: Diagnosis not present

## 2014-09-11 ENCOUNTER — Other Ambulatory Visit: Payer: Self-pay

## 2014-09-11 DIAGNOSIS — Z1231 Encounter for screening mammogram for malignant neoplasm of breast: Secondary | ICD-10-CM

## 2014-10-03 DIAGNOSIS — M5416 Radiculopathy, lumbar region: Secondary | ICD-10-CM | POA: Diagnosis not present

## 2014-10-17 ENCOUNTER — Encounter (INDEPENDENT_AMBULATORY_CARE_PROVIDER_SITE_OTHER): Payer: Self-pay

## 2014-10-17 ENCOUNTER — Ambulatory Visit
Admission: RE | Admit: 2014-10-17 | Discharge: 2014-10-17 | Disposition: A | Discharge status: Home / Self Care | Payer: Medicare Other | Source: Ambulatory Visit

## 2014-10-17 DIAGNOSIS — Z1231 Encounter for screening mammogram for malignant neoplasm of breast: Secondary | ICD-10-CM | POA: Diagnosis not present

## 2014-10-17 DIAGNOSIS — M7502 Adhesive capsulitis of left shoulder: Secondary | ICD-10-CM | POA: Diagnosis not present

## 2014-10-17 DIAGNOSIS — M4806 Spinal stenosis, lumbar region: Secondary | ICD-10-CM | POA: Diagnosis not present

## 2014-10-25 DIAGNOSIS — M7502 Adhesive capsulitis of left shoulder: Secondary | ICD-10-CM | POA: Diagnosis not present

## 2014-11-01 DIAGNOSIS — M7502 Adhesive capsulitis of left shoulder: Secondary | ICD-10-CM | POA: Diagnosis not present

## 2015-02-13 DIAGNOSIS — Z0000 Encounter for general adult medical examination without abnormal findings: Secondary | ICD-10-CM | POA: Diagnosis not present

## 2015-02-13 DIAGNOSIS — Z6841 Body Mass Index (BMI) 40.0 and over, adult: Secondary | ICD-10-CM | POA: Diagnosis not present

## 2015-02-13 DIAGNOSIS — E663 Overweight: Secondary | ICD-10-CM | POA: Diagnosis not present

## 2015-02-13 DIAGNOSIS — I10 Essential (primary) hypertension: Secondary | ICD-10-CM | POA: Diagnosis not present

## 2015-02-13 DIAGNOSIS — Z1389 Encounter for screening for other disorder: Secondary | ICD-10-CM | POA: Diagnosis not present

## 2015-02-13 DIAGNOSIS — E780 Pure hypercholesterolemia: Secondary | ICD-10-CM | POA: Diagnosis not present

## 2015-02-13 DIAGNOSIS — F329 Major depressive disorder, single episode, unspecified: Secondary | ICD-10-CM | POA: Diagnosis not present

## 2015-03-27 DIAGNOSIS — M4806 Spinal stenosis, lumbar region: Secondary | ICD-10-CM | POA: Diagnosis not present

## 2015-03-28 DIAGNOSIS — Z23 Encounter for immunization: Secondary | ICD-10-CM | POA: Diagnosis not present

## 2015-07-17 DIAGNOSIS — M1711 Unilateral primary osteoarthritis, right knee: Secondary | ICD-10-CM | POA: Diagnosis not present

## 2015-07-17 DIAGNOSIS — M170 Bilateral primary osteoarthritis of knee: Secondary | ICD-10-CM | POA: Diagnosis not present

## 2015-07-17 DIAGNOSIS — M25562 Pain in left knee: Secondary | ICD-10-CM | POA: Diagnosis not present

## 2015-07-17 DIAGNOSIS — R262 Difficulty in walking, not elsewhere classified: Secondary | ICD-10-CM | POA: Diagnosis not present

## 2015-07-17 DIAGNOSIS — M25561 Pain in right knee: Secondary | ICD-10-CM | POA: Diagnosis not present

## 2015-07-24 DIAGNOSIS — M1711 Unilateral primary osteoarthritis, right knee: Secondary | ICD-10-CM | POA: Diagnosis not present

## 2015-07-24 DIAGNOSIS — M25561 Pain in right knee: Secondary | ICD-10-CM | POA: Diagnosis not present

## 2015-07-31 DIAGNOSIS — M25561 Pain in right knee: Secondary | ICD-10-CM | POA: Diagnosis not present

## 2015-07-31 DIAGNOSIS — M1711 Unilateral primary osteoarthritis, right knee: Secondary | ICD-10-CM | POA: Diagnosis not present

## 2015-07-31 DIAGNOSIS — R2689 Other abnormalities of gait and mobility: Secondary | ICD-10-CM | POA: Diagnosis not present

## 2015-08-07 DIAGNOSIS — M25561 Pain in right knee: Secondary | ICD-10-CM | POA: Diagnosis not present

## 2015-08-07 DIAGNOSIS — M1711 Unilateral primary osteoarthritis, right knee: Secondary | ICD-10-CM | POA: Diagnosis not present

## 2015-08-14 DIAGNOSIS — M25562 Pain in left knee: Secondary | ICD-10-CM | POA: Diagnosis not present

## 2015-08-14 DIAGNOSIS — F329 Major depressive disorder, single episode, unspecified: Secondary | ICD-10-CM | POA: Diagnosis not present

## 2015-08-14 DIAGNOSIS — I10 Essential (primary) hypertension: Secondary | ICD-10-CM | POA: Diagnosis not present

## 2015-08-14 DIAGNOSIS — Z6841 Body Mass Index (BMI) 40.0 and over, adult: Secondary | ICD-10-CM | POA: Diagnosis not present

## 2015-08-14 DIAGNOSIS — M25561 Pain in right knee: Secondary | ICD-10-CM | POA: Diagnosis not present

## 2015-08-14 DIAGNOSIS — M170 Bilateral primary osteoarthritis of knee: Secondary | ICD-10-CM | POA: Diagnosis not present

## 2015-08-14 DIAGNOSIS — E7800 Pure hypercholesterolemia, unspecified: Secondary | ICD-10-CM | POA: Diagnosis not present

## 2015-08-14 DIAGNOSIS — E663 Overweight: Secondary | ICD-10-CM | POA: Diagnosis not present

## 2015-08-21 DIAGNOSIS — M25562 Pain in left knee: Secondary | ICD-10-CM | POA: Diagnosis not present

## 2015-08-21 DIAGNOSIS — M1712 Unilateral primary osteoarthritis, left knee: Secondary | ICD-10-CM | POA: Diagnosis not present

## 2015-08-28 DIAGNOSIS — M1712 Unilateral primary osteoarthritis, left knee: Secondary | ICD-10-CM | POA: Diagnosis not present

## 2015-08-28 DIAGNOSIS — M25562 Pain in left knee: Secondary | ICD-10-CM | POA: Diagnosis not present

## 2015-09-04 DIAGNOSIS — M25562 Pain in left knee: Secondary | ICD-10-CM | POA: Diagnosis not present

## 2015-09-04 DIAGNOSIS — M1712 Unilateral primary osteoarthritis, left knee: Secondary | ICD-10-CM | POA: Diagnosis not present

## 2015-09-11 ENCOUNTER — Other Ambulatory Visit: Payer: Self-pay

## 2015-09-11 DIAGNOSIS — M1712 Unilateral primary osteoarthritis, left knee: Secondary | ICD-10-CM | POA: Diagnosis not present

## 2015-09-11 DIAGNOSIS — M25562 Pain in left knee: Secondary | ICD-10-CM | POA: Diagnosis not present

## 2015-09-11 DIAGNOSIS — Z1231 Encounter for screening mammogram for malignant neoplasm of breast: Secondary | ICD-10-CM

## 2015-09-26 DIAGNOSIS — M79676 Pain in unspecified toe(s): Secondary | ICD-10-CM | POA: Diagnosis not present

## 2015-10-23 ENCOUNTER — Ambulatory Visit
Admission: RE | Admit: 2015-10-23 | Discharge: 2015-10-23 | Disposition: A | Discharge status: Home / Self Care | Payer: Medicare Other | Source: Ambulatory Visit

## 2015-10-23 DIAGNOSIS — Z1231 Encounter for screening mammogram for malignant neoplasm of breast: Secondary | ICD-10-CM

## 2015-11-20 DIAGNOSIS — M545 Low back pain: Secondary | ICD-10-CM | POA: Diagnosis not present

## 2015-11-20 DIAGNOSIS — M4800 Spinal stenosis, site unspecified: Secondary | ICD-10-CM | POA: Diagnosis not present

## 2015-11-20 DIAGNOSIS — M1310 Monoarthritis, not elsewhere classified, unspecified site: Secondary | ICD-10-CM | POA: Diagnosis not present

## 2015-12-18 DIAGNOSIS — M4806 Spinal stenosis, lumbar region: Secondary | ICD-10-CM | POA: Diagnosis not present

## 2016-01-13 DIAGNOSIS — R42 Dizziness and giddiness: Secondary | ICD-10-CM | POA: Diagnosis not present

## 2016-01-21 DIAGNOSIS — R42 Dizziness and giddiness: Secondary | ICD-10-CM | POA: Diagnosis not present

## 2016-01-27 DIAGNOSIS — R42 Dizziness and giddiness: Secondary | ICD-10-CM | POA: Diagnosis not present

## 2016-04-09 DIAGNOSIS — Z23 Encounter for immunization: Secondary | ICD-10-CM | POA: Diagnosis not present

## 2016-04-09 DIAGNOSIS — M109 Gout, unspecified: Secondary | ICD-10-CM | POA: Diagnosis not present

## 2016-04-09 DIAGNOSIS — S99921A Unspecified injury of right foot, initial encounter: Secondary | ICD-10-CM | POA: Diagnosis not present

## 2016-05-18 IMAGING — CT CT HEAD W/O CM
2 series · 16 of 30 positions shown, 20 images · non-contrast
Comparison: 12/04/2003

CLINICAL DATA: Weakness

EXAM:
CT HEAD WITHOUT CONTRAST
TECHNIQUE: Contiguous axial images were obtained from the base of the skull
through the vertex without intravenous contrast.

[Series 201: head w/o, idose (1) · axial · non-contrast · 0.49mm/px · z∈[+41,+161]mm · 13 of 30 slices shown, 17 images]
[im 3/30  brain]
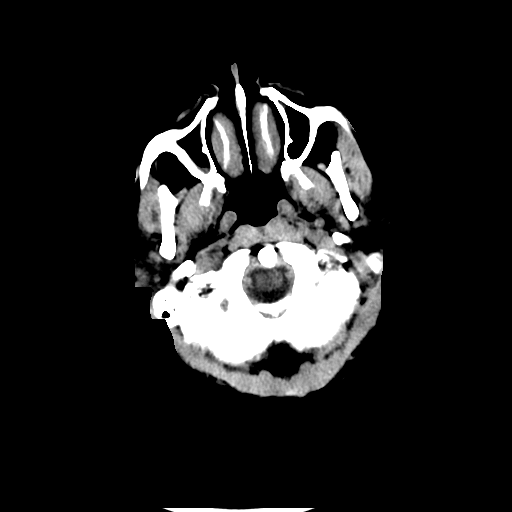
[im 3/30  bone]
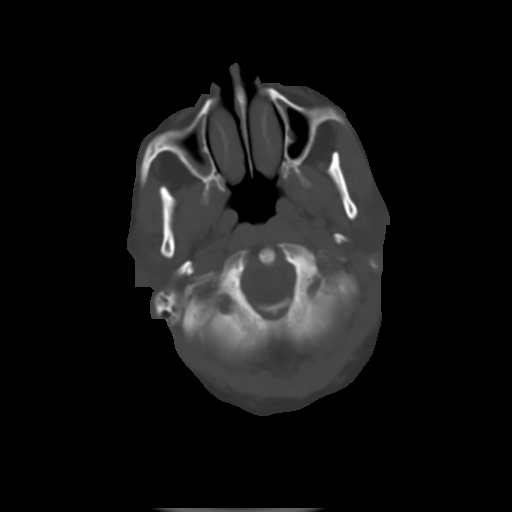
[im 5/30  brain]
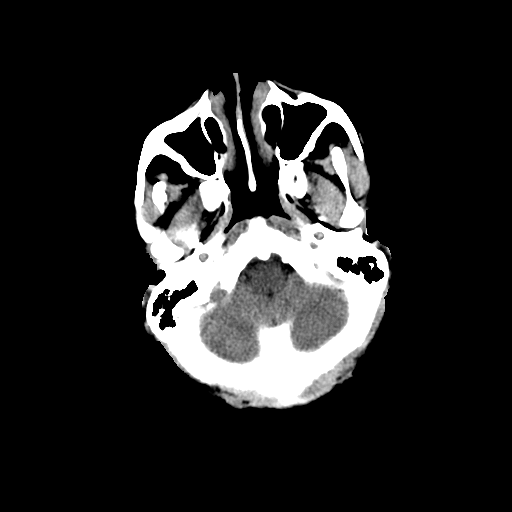
[im 7/30  brain]
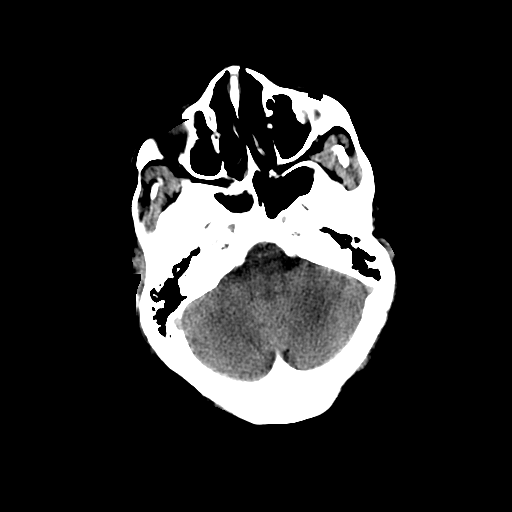
[im 9/30  brain]
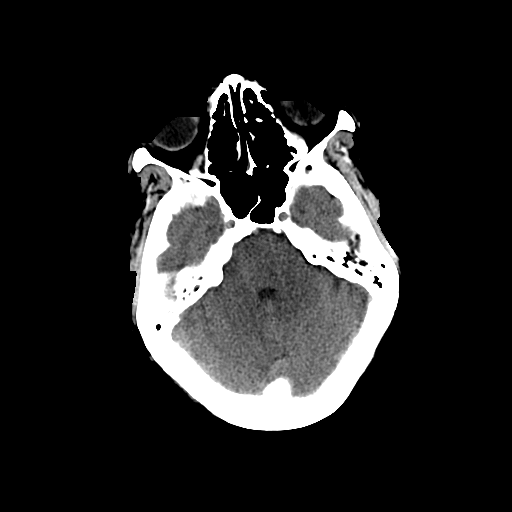
[im 11/30  brain]
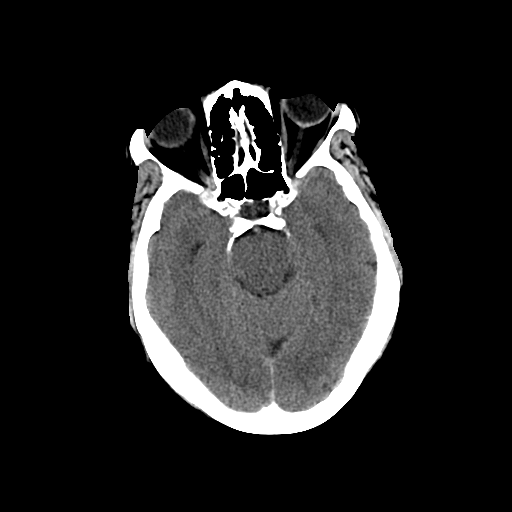
[im 11/30  bone]
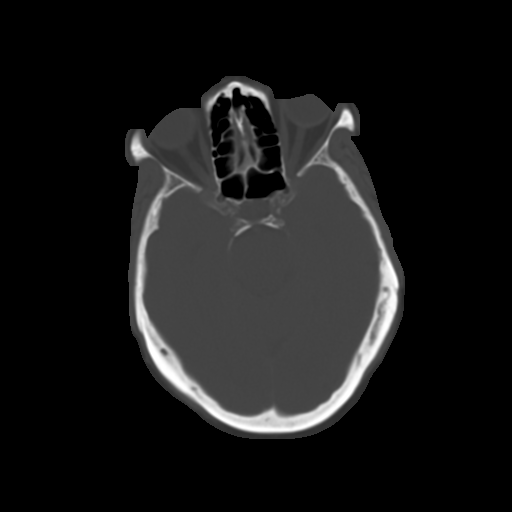
[im 13/30  brain]
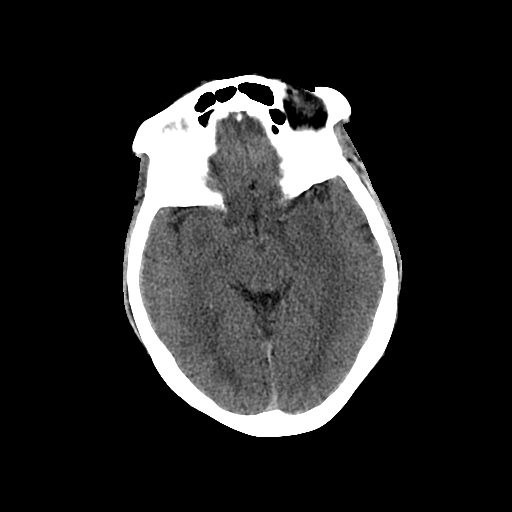
[im 15/30  brain]
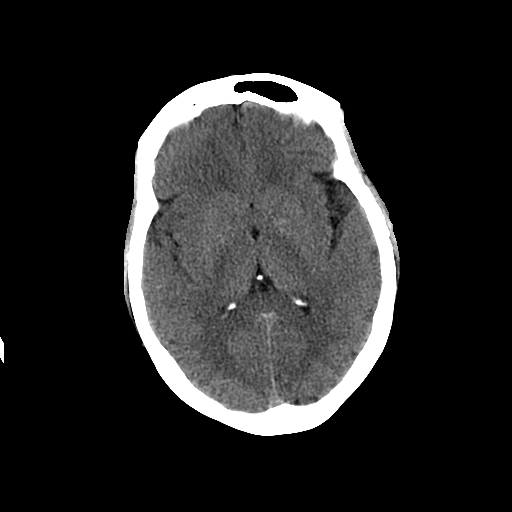
[im 17/30  brain]
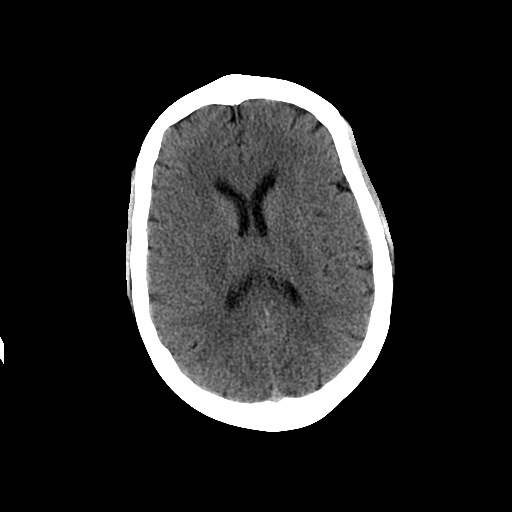
[im 19/30  brain]
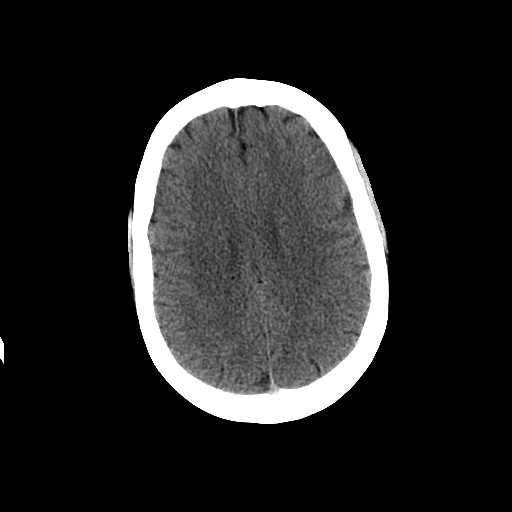
[im 19/30  bone]
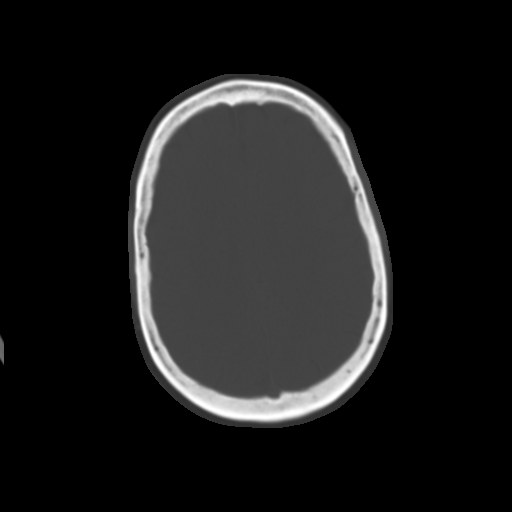
[im 21/30  brain]
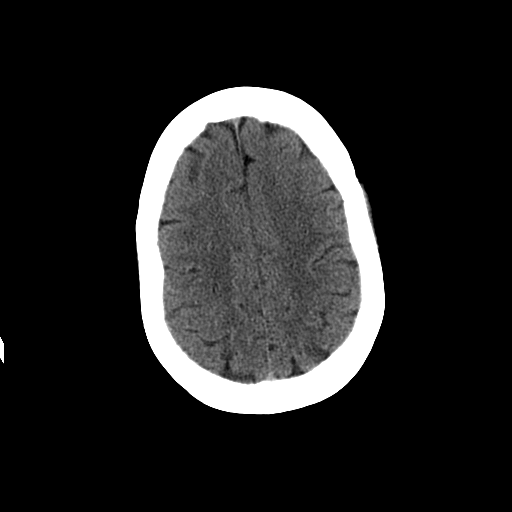
[im 23/30  brain]
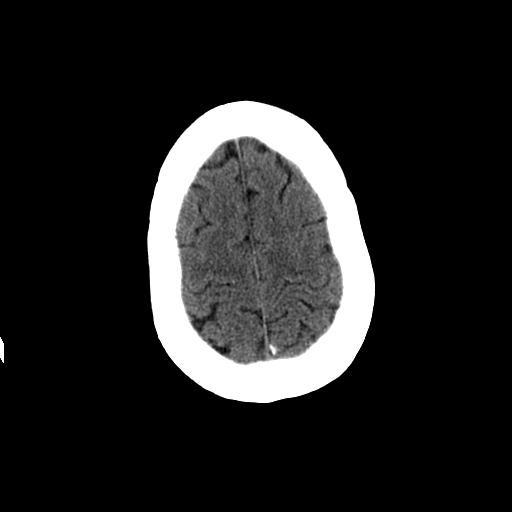
[im 25/30  brain]
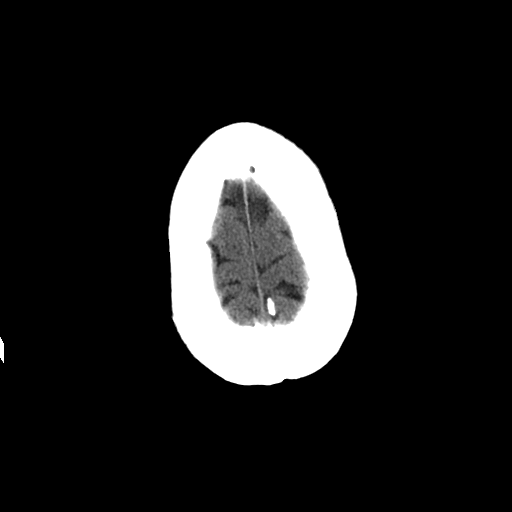
[im 27/30  brain]
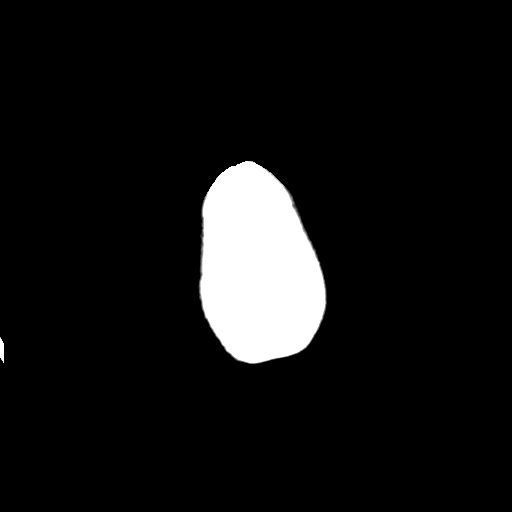
[im 27/30  bone]
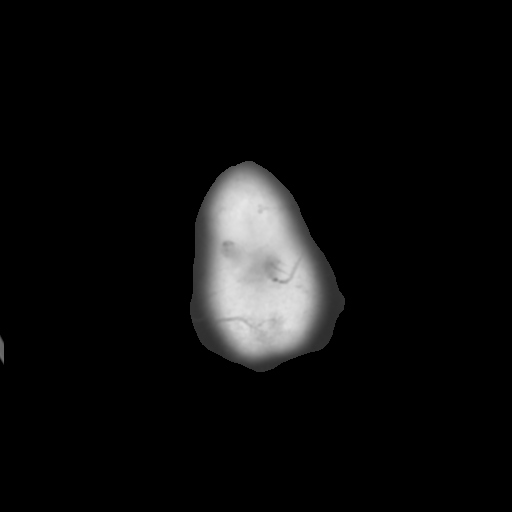

[Series 202: head w/o bone, idose (1) · axial · non-contrast · 0.49mm/px · z∈[+41,+81]mm · 3 of 30 slices shown]
[im 3/30  bone]
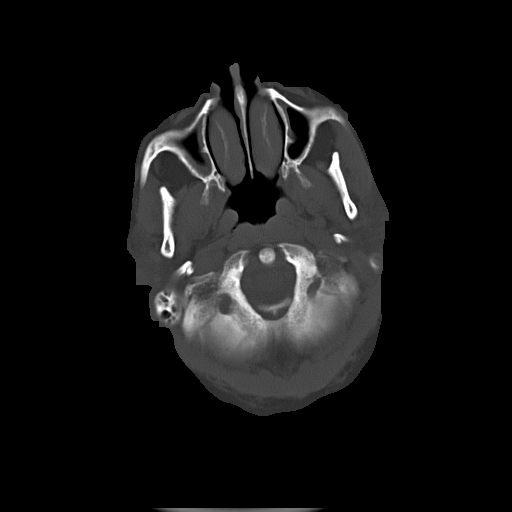
[im 7/30  bone]
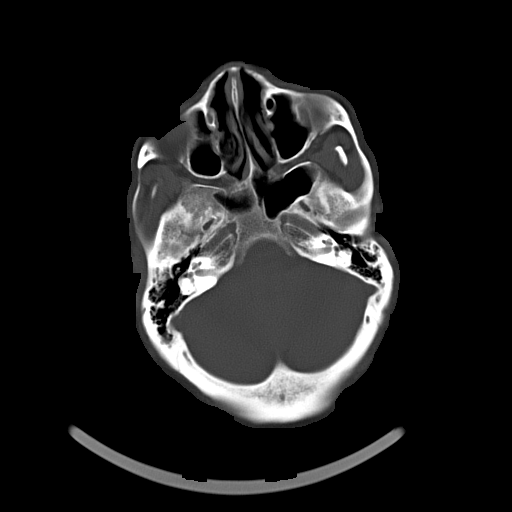
[im 11/30  bone]
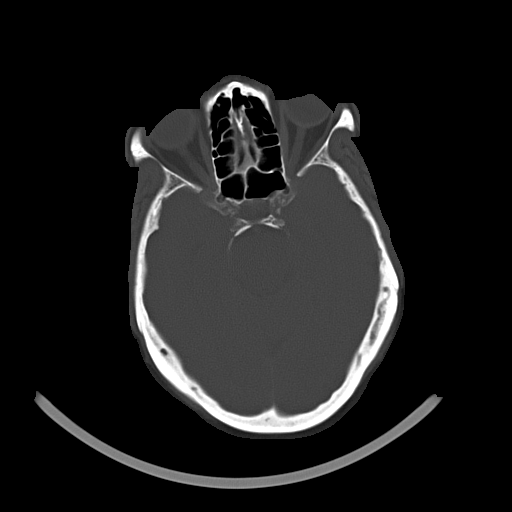

[16 of 30 positions shown; findings below may reference images not displayed]

FINDINGS: Ventricle size is normal. Mild chronic microvascular ischemic change
in the periventricular white matter similar to the prior study

Negative for acute infarct.  Negative for hemorrhage or mass lesion.

Calvarium intact.  Mild mucosal edema in the maxillary sinus.
IMPRESSION: Mild chronic microvascular ischemic change in the white matter. No
acute abnormality.
